# Patient Record
Sex: Male | Born: 1980 | Race: White | Hispanic: No | Marital: Single | Smoking: Current every day smoker
Health system: Southern US, Community
[De-identification: ages and names within clinical notes are randomized; demographics above are authoritative.]

## PROBLEM LIST (undated history)

## (undated) ENCOUNTER — Emergency Department (HOSPITAL_COMMUNITY): Payer: BLUE CROSS/BLUE SHIELD | Source: Home / Self Care

---
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
## 2001-10-08 ENCOUNTER — Emergency Department (HOSPITAL_COMMUNITY): Admission: EM | Admit: 2001-10-08 | Discharge: 2001-10-08 | Payer: Self-pay | Admitting: Emergency Medicine

## 2003-11-13 HISTORY — PX: ORIF FEMUR FRACTURE: SHX2119

## 2004-08-10 ENCOUNTER — Ambulatory Visit: Payer: Self-pay | Admitting: Physical Medicine & Rehabilitation

## 2004-08-10 ENCOUNTER — Inpatient Hospital Stay (HOSPITAL_COMMUNITY): Admission: AC | Admit: 2004-08-10 | Discharge: 2004-08-17 | Payer: Self-pay

## 2005-09-10 ENCOUNTER — Emergency Department (HOSPITAL_COMMUNITY): Admission: EM | Admit: 2005-09-10 | Discharge: 2005-09-11 | Payer: Self-pay | Admitting: Emergency Medicine

## 2005-09-27 ENCOUNTER — Encounter (INDEPENDENT_AMBULATORY_CARE_PROVIDER_SITE_OTHER): Payer: Self-pay | Admitting: *Deleted

## 2005-09-27 ENCOUNTER — Inpatient Hospital Stay (HOSPITAL_COMMUNITY): Admission: AD | Admit: 2005-09-27 | Discharge: 2005-09-29 | Payer: Self-pay | Admitting: Orthopedic Surgery

## 2005-10-04 ENCOUNTER — Emergency Department (HOSPITAL_COMMUNITY): Admission: EM | Admit: 2005-10-04 | Discharge: 2005-10-04 | Payer: Self-pay | Admitting: Emergency Medicine

## 2006-03-10 ENCOUNTER — Emergency Department (HOSPITAL_COMMUNITY): Admission: EM | Admit: 2006-03-10 | Discharge: 2006-03-10 | Payer: Self-pay | Admitting: Emergency Medicine

## 2006-03-12 ENCOUNTER — Emergency Department (HOSPITAL_COMMUNITY): Admission: EM | Admit: 2006-03-12 | Discharge: 2006-03-12 | Payer: Self-pay | Admitting: Family Medicine

## 2008-07-12 ENCOUNTER — Emergency Department (HOSPITAL_COMMUNITY): Admission: EM | Admit: 2008-07-12 | Discharge: 2008-07-12 | Payer: Self-pay | Admitting: Family Medicine

## 2009-06-20 ENCOUNTER — Emergency Department (HOSPITAL_COMMUNITY): Admission: EM | Admit: 2009-06-20 | Discharge: 2009-06-20 | Payer: Self-pay | Admitting: Emergency Medicine
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
## 2011-03-30 NOTE — Discharge Summary (Signed)
Blake [name], Blake [name]            ACCOUNT NO.:  [id number]   MEDICAL RECORD NO.:  [id number]          PATIENT TYPE:  INP   LOCATION:  5007                         FACILITY:  [facility]   PHYSICIAN:  Doralee Albino. Carola Frost, M.D. DATE OF BIRTH:  23-May-1981   DATE OF ADMISSION:  09/27/2005  DATE OF DISCHARGE:  09/29/2005                                 DISCHARGE SUMMARY   DISCHARGE DIAGNOSIS:  Left femoral nonunion.   PROCEDURES PERFORMED:  1.  A left femur removal of broken deep implant.  2.  Open treatment of femoral nonunion with compression and OP1      allografting.  3.  Intramedullary nailing using a Howmedica T2, 12 x 340 mm nail.   BRIEF SUMMARY OF HOSPITAL COURSE:  Blake [name] was taken to the operating room  on November 16 after a full discussion of the risk and benefits of surgery  for removal of his broken femoral nail and intramedullary compression  nailing using the T2 device.  At that time, we also anticipated treatment of  his long bone nonunion with OP1 allograft.  The patient tolerated the  procedure quite well, postoperatively was on a PCA for pain control and was  unable to be transitioned to p.o. pain meds alone until postop day two.  At  that time, he was tolerating a diet, had resumed urinary and bowel function,  and was deemed stable for discharge.  He was able to participate with  physical therapy using weight bearing as tolerated on his left lower  extremity.  He was on Lovenox for DVT prophylaxis while in the hospital with  plans to transition to an aspirin daily at discharge.   DISCHARGE INSTRUCTIONS:  Blake [name] will be weight bearing as tolerated on  the left lower extremity.  He will take Percocet one to two every six hours  as needed for pain, Oxycodone 5 mg every three hours in between Percocet  doses as needed for breakthrough pain and he also can take Robaxin for  muscle spasms.  He may shower after 72 hours, keep the leg under  compression, and practice active  and passive range of motion of the knee and  hip.  He will follow up at the clinic in 10 days for further evaluation and  new x-rays.      Doralee Albino. Carola Frost, M.D.  Electronically Signed     MHH/MEDQ  D:  12/06/2005  T:  12/06/2005  Job:  [id number]
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
## 2011-03-30 NOTE — Op Note (Signed)
NAMETREVANTE, Blake [name]            ACCOUNT NO.:  [id number]   MEDICAL RECORD NO.:  [id number]          PATIENT TYPE:  INP   LOCATION:  5031                         FACILITY:  [facility]   PHYSICIAN:  Jene Every, M.D.    DATE OF BIRTH:  10-25-1981   DATE OF PROCEDURE:  08/13/2004  DATE OF DISCHARGE:                                 OPERATIVE REPORT   PREOPERATIVE DIAGNOSIS:  Status post IM rod and I&D of open femur fracture.   POSTOPERATIVE DIAGNOSIS:  Status post intramedullary rod and I&D of open  femur fracture.   PROCEDURE:  Repeat I&D, debridement of left thigh wound.   SURGEON:  Jene Every, M.D.   ASSISTANT:  None.   ANESTHESIA:  General.   INDICATIONS FOR PROCEDURE:  This is a 30 year old status post IM rodding for  an open femur fracture.  He had a small puncture wound on the lateral aspect  of the thigh.  We did an I&D initially and performed a retrograde rodding.  Repeat I&D was indicated for a second look repeat I&D, to diminish the  chance of infection. Discussed with him the risks and benefits of this,  again, mainly to reduce the chance of infection.   DESCRIPTION OF PROCEDURE:  Placed in the supine position. After administered  of adequate general anesthesia, 1 g Kefzol, the left thigh region was block  draped, prepped and draped in the usual sterile fashion.  Removed the  previous stapled incisions and the Hemovac. This was done prior to the  prepping and draping.  I opened the wound and opened the loosely closed deep  facial layer.  Palpated the fracture. Delivered 6 L of saline through pulse  lavage and evacuation to the soft tissues, deep to the bone.  Palpated  around the fracture site and it was palpated as reduced and anatomic. There  was no devitalized tissue bleeding noted nor significant swelling of the  compartment.  There was obvious compromise of the vastus lateralis and the  fascia from the initial injury.   After the pulse lavage, we used  antibiotic irrigation. The wound was clean,  no evidence of infection, no retrained debris.  I loosely closed the fascia  with #1 Vicryl in a figure-of-eight suture, subcutaneous tissue was  reapproximated with 2-0 Vicryl simple sutures, skin was reapproximated with  staples.  Wound was dressed sterilely. I examined the knee again, there was  no instability of the knee noted.  The knee incisions looked excellent.  I  redressed those.  He had good pulses.   After the procedure, we awoke him without difficulty and transported him to  the recovery room in satisfactory condition. The patient tolerated the  procedure well, there were no complications. Blood loss minimal.       JB/MEDQ  D:  08/13/2004  T:  08/13/2004  Job:  [id number]

## 2011-03-30 NOTE — Op Note (Signed)
[place]  Patient:    Blake [name], Blake [name]                    MRN: [id number] Proc. Date: 03/21/00 Adm. Date:  [id number] Disc. Date: [id number] Attending:  Melody Comas.                           Operative Report  PREOPERATIVE DIAGNOSIS:  Probable epidermal inclusion cyst of the right earlobe.  POSTOPERATIVE DIAGNOSIS:  Probable epidermal inclusion cyst of the right earlobe.  OPERATION:  Excisional biopsy of dermal inclusion cyst of right earlobe.  SURGEON:  Janet Berlin. Dan Humphreys, M.D.  ANESTHESIA:  Local.  INDICATION:  Blake [name] is a 30 year old young man who has experienced cyclic swelling and pain in his right earlobe responsive to antibiotics.  It was felt to represent an epidermal inclusion cyst.  In order to prevent further development of this problem in the future, he was taken to the operating room for removal of the presumed epidermal inclusion cyst.  DESCRIPTION OF PROCEDURE:  The patient was brought back to the minor surgery operating room in the [facility].  He was placed on the table in the supine position with the head turned to the left.  The right ear and surrounding tissue were sterilely prepped and draped.  I infiltrated the earlobe with 1% Xylocaine containing epinephrine.  Through an elliptical excision, I dissected the soft tissue mass free from the surrounding parenchyma of the earlobe.  This lesion had connection both to the medial and lateral surfaces of the earlobe, visible as a reddened punctae.  Accordingly, I found it necessary to excise a small ellipse of skin on the lateral surface of the earlobe (external surface).  The resulting defect was closed with a running 6-0 monofilament nylon suture on the medial or internal surface and interrupted 6-0 monofilament nylon sutures on the external surface.  The removed lesion was found to be measure approximately 1 cm in maximal diameter.  The  patient and his father were given wound care instructions.  I will see him back in the office early next week for wound check, probable suture removal, and review of his final pathology report. DD:  03/21/00 TD:  03/23/00 Job: [id number]

## 2014-10-28 ENCOUNTER — Emergency Department (INDEPENDENT_AMBULATORY_CARE_PROVIDER_SITE_OTHER)
Admission: EM | Admit: 2014-10-28 | Discharge: 2014-10-28 | Disposition: A | Discharge status: Home / Self Care | Payer: Self-pay | Source: Home / Self Care | Attending: Emergency Medicine | Admitting: Emergency Medicine

## 2014-10-28 ENCOUNTER — Encounter (HOSPITAL_COMMUNITY): Payer: Self-pay | Admitting: *Deleted

## 2014-10-28 DIAGNOSIS — H6092 Unspecified otitis externa, left ear: Secondary | ICD-10-CM

## 2014-10-28 MED ORDER — CIPROFLOXACIN-DEXAMETHASONE 0.3-0.1 % OT SUSP: 4.0000 [drp] | Freq: Two times a day (BID) | OTIC | Status: DC

## 2014-10-28 NOTE — Discharge Instructions (Signed)
You have and infection of your ear canal. Use the Ciprodex twice a day for 7 days. Follow up as needed.  Otitis Externa Otitis externa is a germ infection in the outer ear. The outer ear is the area from the eardrum to the outside of the ear. Otitis externa is sometimes called "swimmer's ear." HOME CARE  Put drops in the ear as told by your doctor.  Only take medicine as told by your doctor.  If you have diabetes, your doctor may give you more directions. Follow your doctor's directions.  Keep all doctor visits as told. To avoid another infection:  Keep your ear dry. Use the corner of a towel to dry your ear after swimming or bathing.  Avoid scratching or putting things inside your ear.  Avoid swimming in lakes, dirty water, or pools that use a chemical called chlorine poorly.  You may use ear drops after swimming. Combine equal amounts of white vinegar and alcohol in a bottle. Put 3 or 4 drops in each ear. GET HELP IF:   You have a fever.  Your ear is still red, puffy (swollen), or painful after 3 days.  You still have yellowish-white fluid (pus) coming from the ear after 3 days.  Your redness, puffiness, or pain gets worse.  You have a really bad headache.  You have redness, puffiness, pain, or tenderness behind your ear. MAKE SURE YOU:   Understand these instructions.  Will watch your condition.  Will get help right away if you are not doing well or get worse. Document Released: 04/16/2008 Document Revised: 03/15/2014 Document Reviewed: 11/15/2011 [facility] Patient Information 2015 [place]Care, [place]. This information is not intended to replace advice given to you by your health care provider. Make sure you discuss any questions you have with your health care provider.

## 2014-10-28 NOTE — ED Provider Notes (Signed)
CSN: [id number]     Arrival date & time 10/28/14  1741 History   First MD Initiated Contact with Patient 10/28/14 1745     Chief Complaint  Patient presents with  . Otalgia   (Consider location/radiation/quality/duration/timing/severity/associated sxs/prior Treatment) HPI  He is a 33 year old man here for left ear pain. This started on December 7. He had amoxicillin left over from a dentist and took 7 days worth of amoxicillin. He states it improves the pain for a while, but it recurred today. He also feels like his hearing is decreased on that side. Denies any fevers, nasal congestion, rhinorrhea, sore throat, cough. He has had a prior ear infection when he was 16.  History reviewed. No pertinent past medical history. Past Surgical History  Procedure Laterality Date  . Orif femur fracture Left 2005    titanium rod   Family History  Problem Relation Age of Onset  . Heart failure Father    History  Substance Use Topics  . Smoking status: Current Every Day Smoker -- 1.00 packs/day    Types: Cigarettes  . Smokeless tobacco: Not on file  . Alcohol Use: 7.2 oz/week    6 Cans of beer, 6 Shots of liquor per week    Review of Systems  HENT: Positive for ear pain.   All other systems reviewed and are negative.   Allergies  Review of patient's allergies indicates not on file.  Home Medications   Prior to Admission medications   Medication Sig Start Date End Date Taking? Authorizing Provider  ciprofloxacin-dexamethasone (CIPRODEX) otic suspension Place 4 drops into the left ear 2 (two) times daily. For 1 week 10/28/14   Charm RingsErin J Demitria Hay, MD   There were no vitals taken for this visit. Physical Exam  Constitutional: He appears well-developed and well-nourished. No distress.  HENT:  Head: Normocephalic and atraumatic.  Right Ear: Tympanic membrane, external ear and ear canal normal.  Left Ear: Tympanic membrane normal.  Left ear canal is erythematous and swollen.  Pain with  traction of the tragus.    ED Course  Procedures (including critical care time) Labs Review Labs Reviewed - No data to display  Imaging Review No results found.   MDM   1. Left otitis externa    Treat with Ciprodex for 7 days. Follow-up as needed.    Charm RingsErin J Hamzeh Tall, MD 10/28/14 [phone]

## 2014-10-28 NOTE — ED Notes (Addendum)
C/o L earache onset last Monday 12/7.  No sore throat, runny nose or cough.  No fever. Took Amoxicillin QID for 7 days (left over from the dentist).  It helped but when he stopped the pain came back this AM.

## 2014-11-01 ENCOUNTER — Emergency Department (HOSPITAL_COMMUNITY): Payer: Self-pay

## 2014-11-01 ENCOUNTER — Emergency Department (HOSPITAL_COMMUNITY)
Admission: EM | Admit: 2014-11-01 | Discharge: 2014-11-01 | Disposition: A | Discharge status: Home / Self Care | Payer: Self-pay | Attending: Emergency Medicine | Admitting: Emergency Medicine

## 2014-11-01 ENCOUNTER — Encounter (HOSPITAL_COMMUNITY): Payer: Self-pay

## 2014-11-01 DIAGNOSIS — S91312A Laceration without foreign body, left foot, initial encounter: Secondary | ICD-10-CM | POA: Insufficient documentation

## 2014-11-01 DIAGNOSIS — IMO0002 Reserved for concepts with insufficient information to code with codable children: Secondary | ICD-10-CM

## 2014-11-01 DIAGNOSIS — Z720 Tobacco use: Secondary | ICD-10-CM | POA: Insufficient documentation

## 2014-11-01 DIAGNOSIS — Y9289 Other specified places as the place of occurrence of the external cause: Secondary | ICD-10-CM | POA: Insufficient documentation

## 2014-11-01 DIAGNOSIS — Z792 Long term (current) use of antibiotics: Secondary | ICD-10-CM | POA: Insufficient documentation

## 2014-11-01 DIAGNOSIS — Y9389 Activity, other specified: Secondary | ICD-10-CM | POA: Insufficient documentation

## 2014-11-01 DIAGNOSIS — Y998 Other external cause status: Secondary | ICD-10-CM | POA: Insufficient documentation

## 2014-11-01 DIAGNOSIS — W293XXA Contact with powered garden and outdoor hand tools and machinery, initial encounter: Secondary | ICD-10-CM | POA: Insufficient documentation

## 2014-11-01 MED ORDER — SODIUM CHLORIDE 0.9 % IV BOLUS (SEPSIS)
1000.0000 mL | Freq: Once | INTRAVENOUS | Status: AC
  Administered 2014-11-01: 1000 mL via INTRAVENOUS

## 2014-11-01 MED ORDER — LIDOCAINE HCL 2 % IJ SOLN
10.0000 mL | Freq: Once | INTRAMUSCULAR | Status: AC
  Administered 2014-11-01: 200 mg via INTRADERMAL
  Filled 2014-11-01: qty 20

## 2014-11-01 MED ORDER — NAPROXEN 500 MG PO TABS: 500.0000 mg | ORAL_TABLET | Freq: Two times a day (BID) | ORAL | Status: DC

## 2014-11-01 MED ORDER — ONDANSETRON HCL 4 MG/2ML IJ SOLN
4.0000 mg | Freq: Once | INTRAMUSCULAR | Status: AC
  Administered 2014-11-01: 4 mg via INTRAVENOUS
  Filled 2014-11-01: qty 2

## 2014-11-01 MED ORDER — HYDROMORPHONE HCL 1 MG/ML IJ SOLN
0.5000 mg | Freq: Once | INTRAMUSCULAR | Status: AC
  Administered 2014-11-01: 0.5 mg via INTRAVENOUS
  Filled 2014-11-01: qty 1

## 2014-11-01 MED ORDER — HYDROCODONE-ACETAMINOPHEN 5-325 MG PO TABS: 1.0000 | ORAL_TABLET | Freq: Four times a day (QID) | ORAL | Status: DC | PRN

## 2014-11-01 MED ORDER — CIPROFLOXACIN HCL 500 MG PO TABS: 500.0000 mg | ORAL_TABLET | Freq: Two times a day (BID) | ORAL | Status: DC

## 2014-11-01 NOTE — Discharge Instructions (Signed)
Keep the cut clean and dry. Wash with soap and water. Topical bacitracin twice a day. If any issues please follow up with Dr. Eulah PontMurphy as referred. Take cipro to prevent infection as prescribe. Return if any issues. Suture removal in 10 days.   Laceration Care, Adult A laceration is a cut or lesion that goes through all layers of the skin and into the tissue just beneath the skin. TREATMENT  Some lacerations may not require closure. Some lacerations may not be able to be closed due to an increased risk of infection. It is important to see your caregiver as soon as possible after an injury to minimize the risk of infection and maximize the opportunity for successful closure. If closure is appropriate, pain medicines may be given, if needed. The wound will be cleaned to help prevent infection. Your caregiver will use stitches (sutures), staples, wound glue (adhesive), or skin adhesive strips to repair the laceration. These tools bring the skin edges together to allow for faster healing and a better cosmetic outcome. However, all wounds will heal with a scar. Once the wound has healed, scarring can be minimized by covering the wound with sunscreen during the day for 1 full year. HOME CARE INSTRUCTIONS  For sutures or staples:  Keep the wound clean and dry.  If you were given a bandage (dressing), you should change it at least once a day. Also, change the dressing if it becomes wet or dirty, or as directed by your caregiver.  Wash the wound with soap and water 2 times a day. Rinse the wound off with water to remove all soap. Pat the wound dry with a clean towel.  After cleaning, apply a thin layer of the antibiotic ointment as recommended by your caregiver. This will help prevent infection and keep the dressing from sticking.  You may shower as usual after the first 24 hours. Do not soak the wound in water until the sutures are removed.  Only take over-the-counter or prescription medicines for pain,  discomfort, or fever as directed by your caregiver.  Get your sutures or staples removed as directed by your caregiver. For skin adhesive strips:  Keep the wound clean and dry.  Do not get the skin adhesive strips wet. You may bathe carefully, using caution to keep the wound dry.  If the wound gets wet, pat it dry with a clean towel.  Skin adhesive strips will fall off on their own. You may trim the strips as the wound heals. Do not remove skin adhesive strips that are still stuck to the wound. They will fall off in time. For wound adhesive:  You may briefly wet your wound in the shower or bath. Do not soak or scrub the wound. Do not swim. Avoid periods of heavy perspiration until the skin adhesive has fallen off on its own. After showering or bathing, gently pat the wound dry with a clean towel.  Do not apply liquid medicine, cream medicine, or ointment medicine to your wound while the skin adhesive is in place. This may loosen the film before your wound is healed.  If a dressing is placed over the wound, be careful not to apply tape directly over the skin adhesive. This may cause the adhesive to be pulled off before the wound is healed.  Avoid prolonged exposure to sunlight or tanning lamps while the skin adhesive is in place. Exposure to ultraviolet light in the first year will darken the scar.  The skin adhesive will usually remain  in place for 5 to 10 days, then naturally fall off the skin. Do not pick at the adhesive film. You may need a tetanus shot if:  You cannot remember when you had your last tetanus shot.  You have never had a tetanus shot. If you get a tetanus shot, your arm may swell, get red, and feel warm to the touch. This is common and not a problem. If you need a tetanus shot and you choose not to have one, there is a rare chance of getting tetanus. Sickness from tetanus can be serious. SEEK MEDICAL CARE IF:   You have redness, swelling, or increasing pain in the  wound.  You see a red line that goes away from the wound.  You have yellowish-white fluid (pus) coming from the wound.  You have a fever.  You notice a bad smell coming from the wound or dressing.  Your wound breaks open before or after sutures have been removed.  You notice something coming out of the wound such as wood or glass.  Your wound is on your hand or foot and you cannot move a finger or toe. SEEK IMMEDIATE MEDICAL CARE IF:   Your pain is not controlled with prescribed medicine.  You have severe swelling around the wound causing pain and numbness or a change in color in your arm, hand, leg, or foot.  Your wound splits open and starts bleeding.  You have worsening numbness, weakness, or loss of function of any joint around or beyond the wound.  You develop painful lumps near the wound or on the skin anywhere on your body. MAKE SURE YOU:   Understand these instructions.  Will watch your condition.  Will get help right away if you are not doing well or get worse. Document Released: 10/29/2005 Document Revised: 01/21/2012 Document Reviewed: 04/24/2011 [facility]Exit[facility] Patient Information 2015 [place]ExitCare, [place]LL[place]. This information is not intended to replace advice given to you by your health care provider. Make sure you discuss any questions you have with your health care provider.

## 2014-11-01 NOTE — ED Notes (Signed)
Pt. Reports approx 15  Min PTA he was cutting trees with a chainsaw, slipped, and cut foot with chainsaw. Pale and diaphoretic on arrival

## 2014-11-01 NOTE — ED Provider Notes (Signed)
CSN: [id number]     Arrival date & time 11/01/14  1105 History   First MD Initiated Contact with Patient 11/01/14 1113     Chief Complaint  Patient presents with  . Extremity Laceration     (Consider location/radiation/quality/duration/timing/severity/associated sxs/prior Treatment) HPI Blake Li is a 33 y.o. male with no medical problems, presents to ED after cutting his left foot on a chain saw. States he was cutting down tree, states he slipped, came to the ground and chainsaw slipped and cut his left foot. States it started bleeding heavily, states he wrapped it with cloth tightly. States tetanus up to date. Denies numbness or weakness in the foot. No other injuries. Did not fall, did not hit head, no LOC.   No past medical history on file. Past Surgical History  Procedure Laterality Date  . Orif femur fracture Left 2005    titanium rod   Family History  Problem Relation Age of Onset  . Heart failure Father    History  Substance Use Topics  . Smoking status: Current Every Day Smoker -- 1.00 packs/day    Types: Cigarettes  . Smokeless tobacco: Not on file  . Alcohol Use: 7.2 oz/week    6 Cans of beer, 6 Shots of liquor per week    Review of Systems  Constitutional: Negative for fever and chills.  Respiratory: Negative for cough, chest tightness and shortness of breath.   Cardiovascular: Negative for chest pain, palpitations and leg swelling.  Gastrointestinal: Negative for nausea, vomiting, abdominal pain, diarrhea and abdominal distention.  Genitourinary: Negative for dysuria, urgency, frequency and hematuria.  Musculoskeletal: Positive for arthralgias. Negative for neck pain and neck stiffness.  Skin: Positive for wound. Negative for rash.  Allergic/Immunologic: Negative for immunocompromised state.  Neurological: Negative for dizziness, weakness, light-headedness, numbness and headaches.      Allergies  Review of patient's allergies indicates no known  allergies.  Home Medications   Prior to Admission medications   Medication Sig Start Date End Date Taking? Authorizing Provider  ciprofloxacin-dexamethasone (CIPRODEX) otic suspension Place 4 drops into the left ear 2 (two) times daily. For 1 week 10/28/14   Charm RingsErin J Honig, MD   BP 124/94 mmHg  Pulse 78  Temp(Src) 97.3 F (36.3 C) (Oral)  Resp 22  SpO2 100% Physical Exam  Constitutional: He appears well-developed and well-nourished. No distress.  HENT:  Head: Normocephalic.  Neck: Normal range of motion. Neck supple.  Cardiovascular: Normal rate, regular rhythm and normal heart sounds.   Pulmonary/Chest: Effort normal and breath sounds normal. No respiratory distress. He has no wheezes. He has no rales.  Abdominal: Soft. There is no tenderness.  Musculoskeletal:  5 cm laceration to the dorsal left foot, laceration extends across the foot over 1st, 2nd distal metatarsals. Full rom of all toes. 5/5 and equal strength against resistance with toes 1-3 flexion and extension. sensation intact distally. Cap refill <2 sec  Skin: Skin is warm and dry.  Nursing note and vitals reviewed.   ED Course  Procedures (including critical care time) Labs Review Labs Reviewed - No data to display  Imaging Review No results found.  LACERATION REPAIR Performed by: Lottie MusselKIRICHENKO, Latalia Etzler A Authorized by: Jaynie CrumbleKIRICHENKO, Estellar Cadena A Consent: Verbal consent obtained. Risks and benefits: risks, benefits and alternatives were discussed Consent given by: patient Patient identity confirmed: provided demographic data Prepped and Draped in normal sterile fashion Wound explored  Laceration Location: left foot  Laceration Length: 5cm  Multiple small foreign bodies removed  Anesthesia:  local infiltration  Local anesthetic: lidocaine 2% wo epinephrine  Anesthetic total: 6 ml  Irrigation method: syringe Amount of cleaning: extensive.  SafeClense used, followed by sponge debridement, and irrigation with  1L of NS.   Skin closure: subcutaneous vicril rapid 4.0, skin prolene 4.0  Number of sutures: 3 subcut, 7 skin  Technique: simple interrupted  Patient tolerance: Patient tolerated the procedure well with no immediate complications.   MDM   Final diagnoses:  Laceration  Foot laceration, left, initial encounter    Patient with a deep laceration to the left foot, from a chain saw. Laceration is irregular, hemostatic at this time. X-rays obtained showed foreign bodies, no bony involvement. I thoroughly irrigated the wound with saline and safe cleanse, scrubbed with a sponge, repaired with sutures. Will start on antibiotic, follow-up with orthopedics. Patient is neurovascularly intact, no evidence of nerve, vascular, or tendon injury. Tetanus up to date Home on cipro given laceration on foot through sock and shoe  Filed Vitals:   11/01/14 1114 11/01/14 1200 11/01/14 1418  BP: 124/94 137/69 119/75  Pulse: 78 78 81  Temp: 97.3 F (36.3 C)  98 F (36.7 C)  TempSrc: Oral  Oral  Resp: 22  22  SpO2: 100% 100% 99%     Lottie Musselatyana A Saleah Rishel, PA-C 11/01/14 1504  Suzi RootsKevin E Steinl, MD 11/01/14 [phone]

## 2014-11-01 NOTE — Progress Notes (Signed)
Orthopedic Tech Progress Note Patient Details:  Blake Li 1980/12/17 [id number]  Ortho Devices Type of Ortho Device: Postop shoe/boot Ortho Device/Splint Location: lle Ortho Device/Splint Interventions: Application   Nigil Braman 11/01/2014, 1:51 PM

## 2017-12-16 ENCOUNTER — Encounter (INDEPENDENT_AMBULATORY_CARE_PROVIDER_SITE_OTHER): Payer: Self-pay

## 2017-12-16 ENCOUNTER — Ambulatory Visit: Payer: BLUE CROSS/BLUE SHIELD | Admitting: Emergency Medicine

## 2017-12-16 ENCOUNTER — Other Ambulatory Visit: Payer: Self-pay

## 2017-12-16 ENCOUNTER — Encounter: Payer: Self-pay | Admitting: Emergency Medicine

## 2017-12-16 VITALS — BP 130/90 | HR 108 | Temp 98.3°F | Resp 16 | Ht 64.75 in | Wt 186.0 lb

## 2017-12-16 DIAGNOSIS — R112 Nausea with vomiting, unspecified: Secondary | ICD-10-CM | POA: Diagnosis not present

## 2017-12-16 DIAGNOSIS — R1010 Upper abdominal pain, unspecified: Secondary | ICD-10-CM | POA: Insufficient documentation

## 2017-12-16 LAB — COMPREHENSIVE METABOLIC PANEL
ALK PHOS: 82 IU/L (ref 39–117)
ALT: 52 IU/L — AB (ref 0–44)
AST: 28 IU/L (ref 0–40)
Albumin/Globulin Ratio: 2.1 (ref 1.2–2.2)
Albumin: 4.9 g/dL (ref 3.5–5.5)
BILIRUBIN TOTAL: 0.5 mg/dL (ref 0.0–1.2)
BUN/Creatinine Ratio: 14 (ref 9–20)
BUN: 12 mg/dL (ref 6–20)
CHLORIDE: 94 mmol/L — AB (ref 96–106)
CO2: 30 mmol/L — ABNORMAL HIGH (ref 20–29)
Calcium: 11.4 mg/dL — ABNORMAL HIGH (ref 8.7–10.2)
Creatinine, Ser: 0.87 mg/dL (ref 0.76–1.27)
GFR calc Af Amer: 129 mL/min/{1.73_m2} (ref >59)
GFR calc non Af Amer: 112 mL/min/{1.73_m2} (ref >59)
GLUCOSE: 128 mg/dL — AB (ref 65–99)
Globulin, Total: 2.3 g/dL (ref 1.5–4.5)
Potassium: 3.7 mmol/L (ref 3.5–5.2)
Sodium: 144 mmol/L (ref 134–144)
Total Protein: 7.2 g/dL (ref 6.0–8.5)

## 2017-12-16 LAB — POCT CBC
GRANULOCYTE PERCENT: 84.1 % — AB (ref 37–80)
HEMATOCRIT: 50.1 % (ref 43.5–53.7)
HEMOGLOBIN: 16.8 g/dL (ref 14.1–18.1)
Lymph, poc: 1.3 (ref 0.6–3.4)
MCH, POC: 30.7 pg (ref 27–31.2)
MCHC: 33.6 g/dL (ref 31.8–35.4)
MCV: 91.3 fL (ref 80–97)
MID (cbc): 0.4 (ref 0–0.9)
MPV: 7.1 fL (ref 0–99.8)
POC GRANULOCYTE: 8.9 — AB (ref 2–6.9)
POC LYMPH PERCENT: 11.9 %L (ref 10–50)
POC MID %: 4 %M (ref 0–12)
Platelet Count, POC: 223 10*3/uL (ref 142–424)
RBC: 5.49 M/uL (ref 4.69–6.13)
RDW, POC: 12.9 %
WBC: 10.6 10*3/uL — AB (ref 4.6–10.2)

## 2017-12-16 MED ORDER — ONDANSETRON 4 MG PO TBDP
4.0000 mg | ORAL_TABLET | Freq: Once | ORAL | Status: AC
  Administered 2017-12-16: 4 mg via ORAL

## 2017-12-16 MED ORDER — ONDANSETRON HCL 4 MG PO TABS: 4.0000 mg | ORAL_TABLET | Freq: Three times a day (TID) | ORAL | 0 refills | Status: DC | PRN

## 2017-12-16 NOTE — Progress Notes (Signed)
Blake Li 37 y.o.   Chief Complaint  Patient presents with  . Abdominal Pain    started 12/15/2017  . Emesis    HISTORY OF PRESENT ILLNESS: This is a 37 y.o. male complaining of nausea and vomiting since yesterday morning.  Also upper abdominal pain.  Denies fever or chills.  Denies rectal bleeding or melena.  Denies flulike symptoms.  On no medications.  Denies EtOH abuse.  HPI.   Prior to Admission medications   Medication Sig Start Date End Date Taking? Authorizing Provider  HYDROcodone-acetaminophen (NORCO) 5-325 MG per tablet Take 1 tablet by mouth every 6 (six) hours as needed for moderate pain. Patient not taking: Reported on 12/16/2017 11/01/14   Jaynie Crumble, PA-C  naproxen (NAPROSYN) 500 MG tablet Take 1 tablet (500 mg total) by mouth 2 (two) times daily. Patient not taking: Reported on 12/16/2017 11/01/14   Jaynie Crumble, PA-C  naproxen sodium (ANAPROX) 220 MG tablet Take 440 mg by mouth 2 (two) times daily as needed (for pain).    [provider]    No Known Allergies  There are no active problems to display for this patient.   No past medical history on file.  Past Surgical History:  Procedure Laterality Date  . FRACTURE SURGERY Left 2002  . ORIF FEMUR FRACTURE Left 2005   titanium rod    Social History   Socioeconomic History  . Marital status: Single    Spouse name: Not on file  . Number of children: Not on file  . Years of education: Not on file  . Highest education level: Not on file  Social Needs  . Financial resource strain: Not on file  . Food insecurity - worry: Not on file  . Food insecurity - inability: Not on file  . Transportation needs - medical: Not on file  . Transportation needs - non-medical: Not on file  Occupational History  . Not on file  Tobacco Use  . Smoking status: Current Every Day Smoker    Packs/day: 1.00    Types: Cigarettes  . Smokeless tobacco: Never Used  Substance and Sexual Activity  .  Alcohol use: Yes    Alcohol/week: 7.2 oz    Types: 6 Cans of beer, 6 Shots of liquor per week  . Drug use: Yes    Frequency: 7.0 times per week    Types: Marijuana  . Sexual activity: Not on file  Other Topics Concern  . Not on file  Social History Narrative  . Not on file    Family History  Problem Relation Age of Onset  . Heart failure Father   . Diabetes Father   . Heart disease Father   . Hyperlipidemia Father      Review of Systems  Constitutional: Positive for fever. Negative for chills.  HENT: Negative for nosebleeds and sore throat.   Eyes: Negative for blurred vision and double vision.  Respiratory: Negative.  Negative for cough and shortness of breath.   Cardiovascular: Negative.  Negative for chest pain and palpitations.  Gastrointestinal: Positive for abdominal pain, nausea and vomiting. Negative for blood in stool, diarrhea and melena.  Genitourinary: Negative for dysuria and hematuria.  Musculoskeletal: Negative for back pain, myalgias and neck pain.  Skin: Negative.  Negative for rash.  Neurological: Negative.  Negative for dizziness and headaches.  Endo/Heme/Allergies: Negative.   All other systems reviewed and are negative.   Vitals:   12/16/17 1128  BP: 130/90  Pulse: (!) 108  Resp: 16  Temp: 98.3 F (36.8 C)  SpO2: 100%    Physical Exam  Constitutional: He is oriented to person, place, and time. He appears well-developed and well-nourished.  HENT:  Head: Normocephalic and atraumatic.  Nose: Nose normal.  Mouth/Throat: Oropharynx is clear and moist. No oropharyngeal exudate.  Eyes: Conjunctivae and EOM are normal. Pupils are equal, round, and reactive to light.  Neck: Normal range of motion. Neck supple. No JVD present.  Cardiovascular: Normal rate, regular rhythm and normal heart sounds.  Pulmonary/Chest: Effort normal and breath sounds normal.  Abdominal: Soft. Bowel sounds are normal. There is tenderness (mild epigastric).    Musculoskeletal: Normal range of motion. He exhibits no edema.  Lymphadenopathy:    He has no cervical adenopathy.  Neurological: He is alert and oriented to person, place, and time. No sensory deficit. He exhibits normal muscle tone.  Skin: Skin is warm and dry. Capillary refill takes less than 2 seconds. No rash noted.  Vitals reviewed.   Results for orders placed or performed in visit on 12/16/17 (from the past 24 hour(s))  POCT CBC     Status: Abnormal   Collection Time: 12/16/17 12:17 PM  Result Value Ref Range   WBC 10.6 (A) 4.6 - 10.2 K/uL   Lymph, poc 1.3 0.6 - 3.4   POC LYMPH PERCENT 11.9 10 - 50 %L   MID (cbc) 0.4 0 - 0.9   POC MID % 4.0 0 - 12 %M   POC Granulocyte 8.9 (A) 2 - 6.9   Granulocyte percent 84.1 (A) 37 - 80 %G   RBC 5.49 4.69 - 6.13 M/uL   Hemoglobin 16.8 14.1 - 18.1 g/dL   HCT, POC 29.550.1 18.843.5 - 53.7 %   MCV 91.3 80 - 97 fL   MCH, POC 30.7 27 - 31.2 pg   MCHC 33.6 31.8 - 35.4 g/dL   RDW, POC 41.612.9 %   Platelet Count, POC 223 142 - 424 K/uL   MPV 7.1 0 - 99.8 fL    ASSESSMENT & PLAN: Ethelene Brownsnthony was seen today for abdominal pain and emesis.  Diagnoses and all orders for this visit:  Intractable vomiting with nausea, unspecified vomiting type -     POCT CBC -     Comprehensive metabolic panel -     ondansetron (ZOFRAN-ODT) disintegrating tablet 4 mg  Upper abdominal pain  Other orders -     ondansetron (ZOFRAN) 4 MG tablet; Take 1 tablet (4 mg total) by mouth every 8 (eight) hours as needed for nausea or vomiting.    Patient Instructions       IF you received an x-ray today, you will receive an invoice from [facility]. Please contact [facility] at [phone] with questions or concerns regarding your invoice.   IF you received labwork today, you will receive an invoice from [facility]. Please contact [facility] at [phone] with questions or concerns regarding your invoice.   Our billing staff will not be able to assist you  with questions regarding bills from these companies.  You will be contacted with the lab results as soon as they are available. The fastest way to get your results is to activate your My Chart account. Instructions are located on the last page of this paperwork. If you have not heard from us regarding the results in 2 weeks, please contact this office.     Vomiting, Adult Vomiting occurs when stomach contents are thrown up and out of the mouth. Many people notice nausea before vomiting.  Vomiting can make you feel weak and dehydrated. Dehydration can make you tired and thirsty, cause you to have a dry mouth, and decrease how often you urinate. Older adults and people who have other diseases or a weak immune system are at higher risk for dehydration.It is important to treat vomiting as told by your health care provider. Follow these instructions at home: Follow your health care provider's instructions about how to care for yourself at home. Eating and drinking Follow these recommendations as told by your health care provider:  Take an oral rehydration solution (ORS). This is a drink that is sold at pharmacies and retail stores.  Eat bland, easy-to-digest foods in small amounts as you are able. These foods include bananas, applesauce, rice, lean meats, toast, and crackers.  Drink clear fluids in small amounts as you are able. Clear fluids include water, ice chips, low-calorie sports drinks, and fruit juice that has water added (diluted fruit juice).  Avoid fluids that contain a lot of sugar or caffeine.  Avoid alcohol and foods that are spicy or fatty.  General instructions   Wash your hands frequently with soap and water. If soap and water are not available, use hand sanitizer. Make sure that everyone in your household washes their hands frequently.  Take over-the-counter and prescription medicines only as told by your health care provider.  Watch your condition for any changes.  Keep  all follow-up visits as told by your health care provider. This is important. Contact a health care provider if:  You have a fever.  You are not able to keep fluids down.  Your vomiting gets worse.  You have new symptoms.  You feel light-headed or dizzy.  You have a headache.  You have muscle cramps. Get help right away if:  You have pain in your chest, neck, arm, or jaw.  You feel extremely weak or you faint.  You have persistent vomiting.  You have vomit that is bright red or looks like black coffee grounds.  You have stools that are bloody or black, or stools that look like tar.  You have severe pain, cramping, or bloating in your abdomen.  You have a severe headache, a stiff neck, or both.  You have a rash.  You have trouble breathing or you are breathing very quickly.  Your heart is beating very quickly.  Your skin feels cold and clammy.  You feel confused.  You have pain while urinating.  You have signs of dehydration, such as: ? Dark urine, or very little or no urine. ? Cracked lips. ? Dry mouth. ? Sunken eyes. ? Sleepiness. ? Weakness. These symptoms may represent a serious problem that is an emergency. Do not wait to see if the symptoms will go away. Get medical help right away. Call your local emergency services (911 in the [place] Do not drive yourself to the hospital. This information is not intended to replace advice given to you by your health care provider. Make sure you discuss any questions you have with your health care provider. Document Released: 11/25/2015 Document Revised: 04/05/2016 Document Reviewed: 07/05/2015 Elsevier Interactive Patient Education  2018 [employer]  Abdominal Pain, Adult Many things can cause belly (abdominal) pain. Most times, belly pain is not dangerous. Many cases of belly pain can be watched and treated at home. Sometimes belly pain is serious, though. Your doctor will try to find the cause of your belly  pain. Follow these instructions at home:  Take over-the-counter and prescription medicines  only as told by your doctor. Do not take medicines that help you poop (laxatives) unless told to by your doctor.  Drink enough fluid to keep your pee (urine) clear or pale yellow.  Watch your belly pain for any changes.  Keep all follow-up visits as told by your doctor. This is important. Contact a doctor if:  Your belly pain changes or gets worse.  You are not hungry, or you lose weight without trying.  You are having trouble pooping (constipated) or have watery poop (diarrhea) for more than 2-3 days.  You have pain when you pee or poop.  Your belly pain wakes you up at night.  Your pain gets worse with meals, after eating, or with certain foods.  You are throwing up and cannot keep anything down.  You have a fever. Get help right away if:  Your pain does not go away as soon as your doctor says it should.  You cannot stop throwing up.  Your pain is only in areas of your belly, such as the right side or the left lower part of the belly.  You have bloody or black poop, or poop that looks like tar.  You have very bad pain, cramping, or bloating in your belly.  You have signs of not having enough fluid or water in your body (dehydration), such as: ? Dark pee, very little pee, or no pee. ? Cracked lips. ? Dry mouth. ? Sunken eyes. ? Sleepiness. ? Weakness. This information is not intended to replace advice given to you by your health care provider. Make sure you discuss any questions you have with your health care provider. Document Released: 04/16/2008 Document Revised: 05/18/2016 Document Reviewed: 04/11/2016 Elsevier Interactive Patient Education  2018 Elsevier Inc.      Edwina Barth, MD [facility]

## 2017-12-16 NOTE — Patient Instructions (Addendum)
IF you received an x-ray today, you will receive an invoice from [facility]. Please contact [facility] at [phone] with questions or concerns regarding your invoice.   IF you received labwork today, you will receive an invoice from [facility]. Please contact [facility] at [phone] with questions or concerns regarding your invoice.   Our billing staff will not be able to assist you with questions regarding bills from these companies.  You will be contacted with the lab results as soon as they are available. The fastest way to get your results is to activate your My Chart account. Instructions are located on the last page of this paperwork. If you have not heard from us regarding the results in 2 weeks, please contact this office.     Vomiting, Adult Vomiting occurs when stomach contents are thrown up and out of the mouth. Many people notice nausea before vomiting. Vomiting can make you feel weak and dehydrated. Dehydration can make you tired and thirsty, cause you to have a dry mouth, and decrease how often you urinate. Older adults and people who have other diseases or a weak immune system are at higher risk for dehydration.It is important to treat vomiting as told by your health care provider. Follow these instructions at home: Follow your health care provider's instructions about how to care for yourself at home. Eating and drinking Follow these recommendations as told by your health care provider:  Take an oral rehydration solution (ORS). This is a drink that is sold at pharmacies and retail stores.  Eat bland, easy-to-digest foods in small amounts as you are able. These foods include bananas, applesauce, rice, lean meats, toast, and crackers.  Drink clear fluids in small amounts as you are able. Clear fluids include water, ice chips, low-calorie sports drinks, and fruit juice that has water added (diluted fruit juice).  Avoid fluids that contain a lot of  sugar or caffeine.  Avoid alcohol and foods that are spicy or fatty.  General instructions   Wash your hands frequently with soap and water. If soap and water are not available, use hand sanitizer. Make sure that everyone in your household washes their hands frequently.  Take over-the-counter and prescription medicines only as told by your health care provider.  Watch your condition for any changes.  Keep all follow-up visits as told by your health care provider. This is important. Contact a health care provider if:  You have a fever.  You are not able to keep fluids down.  Your vomiting gets worse.  You have new symptoms.  You feel light-headed or dizzy.  You have a headache.  You have muscle cramps. Get help right away if:  You have pain in your chest, neck, arm, or jaw.  You feel extremely weak or you faint.  You have persistent vomiting.  You have vomit that is bright red or looks like black coffee grounds.  You have stools that are bloody or black, or stools that look like tar.  You have severe pain, cramping, or bloating in your abdomen.  You have a severe headache, a stiff neck, or both.  You have a rash.  You have trouble breathing or you are breathing very quickly.  Your heart is beating very quickly.  Your skin feels cold and clammy.  You feel confused.  You have pain while urinating.  You have signs of dehydration, such as: ? Dark urine, or very little or no urine. ? Cracked lips. ? Dry mouth. ?  Sunken eyes. ? Sleepiness. ? Weakness. These symptoms may represent a serious problem that is an emergency. Do not wait to see if the symptoms will go away. Get medical help right away. Call your local emergency services (911 in the [place] Do not drive yourself to the hospital. This information is not intended to replace advice given to you by your health care provider. Make sure you discuss any questions you have with your health care  provider. Document Released: 11/25/2015 Document Revised: 04/05/2016 Document Reviewed: 07/05/2015 Elsevier Interactive Patient Education  2018 [employer] Inc[employer]  Abdominal Pain, Adult Many things can cause belly (abdominal) pain. Most times, belly pain is not dangerous. Many cases of belly pain can be watched and treated at home. Sometimes belly pain is serious, though. Your doctor will try to find the cause of your belly pain. Follow these instructions at home:  Take over-the-counter and prescription medicines only as told by your doctor. Do not take medicines that help you poop (laxatives) unless told to by your doctor.  Drink enough fluid to keep your pee (urine) clear or pale yellow.  Watch your belly pain for any changes.  Keep all follow-up visits as told by your doctor. This is important. Contact a doctor if:  Your belly pain changes or gets worse.  You are not hungry, or you lose weight without trying.  You are having trouble pooping (constipated) or have watery poop (diarrhea) for more than 2-3 days.  You have pain when you pee or poop.  Your belly pain wakes you up at night.  Your pain gets worse with meals, after eating, or with certain foods.  You are throwing up and cannot keep anything down.  You have a fever. Get help right away if:  Your pain does not go away as soon as your doctor says it should.  You cannot stop throwing up.  Your pain is only in areas of your belly, such as the right side or the left lower part of the belly.  You have bloody or black poop, or poop that looks like tar.  You have very bad pain, cramping, or bloating in your belly.  You have signs of not having enough fluid or water in your body (dehydration), such as: ? Dark pee, very little pee, or no pee. ? Cracked lips. ? Dry mouth. ? Sunken eyes. ? Sleepiness. ? Weakness. This information is not intended to replace advice given to you by your health care provider. Make sure you  discuss any questions you have with your health care provider. Document Released: 04/16/2008 Document Revised: 05/18/2016 Document Reviewed: 04/11/2016 Elsevier Interactive Patient Education  2018 [employer].

## 2017-12-17 ENCOUNTER — Emergency Department (HOSPITAL_COMMUNITY): Payer: BLUE CROSS/BLUE SHIELD

## 2017-12-17 ENCOUNTER — Emergency Department (HOSPITAL_COMMUNITY)
Admission: EM | Admit: 2017-12-17 | Discharge: 2017-12-18 | Disposition: A | Discharge status: Home / Self Care | Payer: BLUE CROSS/BLUE SHIELD | Attending: Emergency Medicine | Admitting: Emergency Medicine

## 2017-12-17 ENCOUNTER — Encounter (HOSPITAL_COMMUNITY): Payer: Self-pay | Admitting: Emergency Medicine

## 2017-12-17 ENCOUNTER — Ambulatory Visit: Payer: Self-pay

## 2017-12-17 DIAGNOSIS — R1010 Upper abdominal pain, unspecified: Secondary | ICD-10-CM | POA: Diagnosis present

## 2017-12-17 DIAGNOSIS — Z79899 Other long term (current) drug therapy: Secondary | ICD-10-CM | POA: Insufficient documentation

## 2017-12-17 DIAGNOSIS — F17210 Nicotine dependence, cigarettes, uncomplicated: Secondary | ICD-10-CM | POA: Diagnosis not present

## 2017-12-17 DIAGNOSIS — K2900 Acute gastritis without bleeding: Secondary | ICD-10-CM | POA: Insufficient documentation

## 2017-12-17 LAB — COMPREHENSIVE METABOLIC PANEL
ALBUMIN: 4.8 g/dL (ref 3.5–5.0)
ALK PHOS: 76 U/L (ref 38–126)
ALT: 48 U/L (ref 17–63)
AST: 32 U/L (ref 15–41)
Anion gap: 16 — ABNORMAL HIGH (ref 5–15)
BUN: 16 mg/dL (ref 6–20)
CALCIUM: 11.7 mg/dL — AB (ref 8.9–10.3)
CHLORIDE: 90 mmol/L — AB (ref 101–111)
CO2: 31 mmol/L (ref 22–32)
CREATININE: 1.2 mg/dL (ref 0.61–1.24)
GFR calc Af Amer: >60 mL/min (ref >60)
GFR calc non Af Amer: >60 mL/min (ref >60)
GLUCOSE: 122 mg/dL — AB (ref 65–99)
Potassium: 2.9 mmol/L — ABNORMAL LOW (ref 3.5–5.1)
Sodium: 137 mmol/L (ref 135–145)
Total Bilirubin: 0.9 mg/dL (ref 0.3–1.2)
Total Protein: 7.8 g/dL (ref 6.5–8.1)

## 2017-12-17 LAB — URINALYSIS, ROUTINE W REFLEX MICROSCOPIC
Bacteria, UA: NONE SEEN
Bilirubin Urine: NEGATIVE
Glucose, UA: NEGATIVE mg/dL
Ketones, ur: 5 mg/dL — AB
Leukocytes, UA: NEGATIVE
Nitrite: NEGATIVE
PROTEIN: NEGATIVE mg/dL
pH: 6 (ref 5.0–8.0)

## 2017-12-17 LAB — CBC
HCT: 51.4 % (ref 39.0–52.0)
HEMOGLOBIN: 18.8 g/dL — AB (ref 13.0–17.0)
MCH: 32.7 pg (ref 26.0–34.0)
MCHC: 36.6 g/dL — AB (ref 30.0–36.0)
MCV: 89.4 fL (ref 78.0–100.0)
PLATELETS: 218 10*3/uL (ref 150–400)
RBC: 5.75 MIL/uL (ref 4.22–5.81)
RDW: 12.2 % (ref 11.5–15.5)
WBC: 13.2 10*3/uL — ABNORMAL HIGH (ref 4.0–10.5)

## 2017-12-17 LAB — LIPASE, BLOOD: Lipase: 27 U/L (ref 11–51)

## 2017-12-17 MED ORDER — ONDANSETRON 4 MG PO TBDP
4.0000 mg | ORAL_TABLET | Freq: Once | ORAL | Status: AC | PRN
  Administered 2017-12-17: 4 mg via ORAL
  Filled 2017-12-17: qty 1

## 2017-12-17 MED ORDER — PROMETHAZINE HCL 25 MG/ML IJ SOLN
25.0000 mg | Freq: Once | INTRAMUSCULAR | Status: AC
  Administered 2017-12-17: 25 mg via INTRAVENOUS
  Filled 2017-12-17: qty 1

## 2017-12-17 MED ORDER — POTASSIUM CHLORIDE 10 MEQ/100ML IV SOLN
10.0000 meq | INTRAVENOUS | Status: AC
  Administered 2017-12-17: 10 meq via INTRAVENOUS
  Filled 2017-12-17: qty 100

## 2017-12-17 MED ORDER — HYDROMORPHONE HCL 1 MG/ML IJ SOLN
1.0000 mg | Freq: Once | INTRAMUSCULAR | Status: AC
  Administered 2017-12-17: 1 mg via INTRAVENOUS
  Filled 2017-12-17: qty 1

## 2017-12-17 MED ORDER — PANTOPRAZOLE SODIUM 40 MG IV SOLR
40.0000 mg | Freq: Once | INTRAVENOUS | Status: AC
  Administered 2017-12-17: 40 mg via INTRAVENOUS
  Filled 2017-12-17: qty 40

## 2017-12-17 MED ORDER — SODIUM CHLORIDE 0.9 % IV BOLUS (SEPSIS)
1000.0000 mL | Freq: Once | INTRAVENOUS | Status: AC
  Administered 2017-12-17: 1000 mL via INTRAVENOUS

## 2017-12-17 MED ORDER — SODIUM CHLORIDE 0.9 % IJ SOLN
INTRAMUSCULAR | Status: AC
  Filled 2017-12-17: qty 50

## 2017-12-17 MED ORDER — IOPAMIDOL (ISOVUE-300) INJECTION 61%
INTRAVENOUS | Status: AC
  Administered 2017-12-17: 100 mL via INTRAVENOUS
  Filled 2017-12-17: qty 100

## 2017-12-17 MED ORDER — GI COCKTAIL ~~LOC~~
30.0000 mL | Freq: Once | ORAL | Status: AC
  Administered 2017-12-17: 30 mL via ORAL
  Filled 2017-12-17: qty 30
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
## 2017-12-17 NOTE — ED Notes (Signed)
Patient aware that we need a urine specimen 

## 2017-12-17 NOTE — Telephone Encounter (Signed)
Recommend he goes to ER.

## 2017-12-17 NOTE — ED Triage Notes (Signed)
Patient reports he was seen at his PCP for LUQ abdominal pain and emesis onset of Saturday. Was diagnosed with food poisoning but symptoms are persistent and wanted to be reevaluated.

## 2017-12-17 NOTE — Telephone Encounter (Signed)
Pt called to report sudden onset of worsening bilateral upper abdominal pain that started 1000 this am . Pain does not shoot anywhere and onset was gradual. Pt states pain is an 8-9 out of 10 and describes it as "sharp" and "pressure". Pain is intermittent but pt states it does not go away for very long. Pt states nothing makes it better and eating causes pain and induces vomiting. Pt states he thinks it's related to "food poisoning." Care advice given with pt instructed to be NPO. Advised to go to nearest ED and have someone drive him. Called [facility] and [facility] Chales AbrahamsUpdated Shannon and ED disposition. Reason for Disposition . [1] SEVERE pain (e.g., excruciating) AND [2] present > 1 hour  Answer Assessment - Initial Assessment Questions 1. LOCATION: "Where does it hurt?"      Hurts underneath both sides of the rib cage 2. RADIATION: "Does the pain shoot anywhere else?" (e.g., chest, back)     No just contained to abdomen 3. ONSET: "When did the pain begin?" (Minutes, hours or days ago)      1000  4. SUDDEN: "Gradual or sudden onset?"     Gradual onset 5. PATTERN "Does the pain come and go, or is it constant?"    - If constant: "Is it getting better, staying the same, or worsening?"      (Note: Constant means the pain never goes away completely; most serious pain is constant and it progresses)     - If intermittent: "How long does it last?" "Do you have pain now?"     (Note: Intermittent means the pain goes away completely between bouts)     Comes and goes  6. SEVERITY: "How bad is the pain?"  (e.g., Scale 1-10; mild, moderate, or severe)    - MILD (1-3): doesn't interfere with normal activities, abdomen soft and not tender to touch     - MODERATE (4-7): interferes with normal activities or awakens from sleep, tender to touch     - SEVERE (8-10): excruciating pain, doubled over, unable to do any normal activities       8-9 7. RECURRENT SYMPTOM: "Have you ever had this type of  abdominal pain before?" If so, ask: "When was the last time?" and "What happened that time?"      no 8. CAUSE: "What do you think is causing the abdominal pain?"     Questioning food poisoning  9. RELIEVING/AGGRAVATING FACTORS: "What makes it better or worse?" (e.g., movement, antacids, bowel movement)     Nothing makes it better- eating makes it worse 10. OTHER SYMPTOMS: "Has there been any vomiting, diarrhea, constipation, or urine problems?"       Vomiting past 3 days and constipated past 3 days but has not eaten since Sunday  Protocols used: ABDOMINAL PAIN - MALE-A-AH

## 2017-12-18 ENCOUNTER — Encounter: Payer: Self-pay | Admitting: Radiology

## 2017-12-18 MED ORDER — SUCRALFATE 1 G PO TABS: 1.0000 g | ORAL_TABLET | Freq: Three times a day (TID) | ORAL | 0 refills | Status: DC

## 2017-12-18 MED ORDER — POTASSIUM CHLORIDE ER 10 MEQ PO TBCR: 10.0000 meq | EXTENDED_RELEASE_TABLET | Freq: Two times a day (BID) | ORAL | 0 refills | Status: DC

## 2017-12-18 MED ORDER — PROMETHAZINE HCL 25 MG PO TABS: 25.0000 mg | ORAL_TABLET | Freq: Four times a day (QID) | ORAL | 0 refills | Status: DC | PRN

## 2017-12-18 NOTE — Discharge Instructions (Addendum)
Take 1 tablet of Phenergan every 6 hours as needed for nausea or vomiting.  Take 1 tablet of Carafate by mouth with meals and at bedtime, up to 4 times per day.  Take 1 tablet of potassium chloride twice daily for the next 3 days.  You can also purchase pantoprazole, which is available over the counter to take daily for the next 2 weeks. Avoid alcohol until your symptoms improve.  Drink 6-8 glasses of 8 ounces of water or electrolyte drinks, such as Gatorade daily. I have attached information on bland diets that include foods that are easy to digest.  Tylenol and ibuprofen can be taken at home for pain.  If you develop new or worsening symptoms, including vomiting despite taking Phenergan, a high fever despite taking Tylenol, severe abdominal pain, or new concerning symptoms, he is return to the emergency department for re-evaluation.

## 2018-01-13 ENCOUNTER — Encounter (HOSPITAL_COMMUNITY): Payer: Self-pay | Admitting: Emergency Medicine

## 2018-01-13 ENCOUNTER — Ambulatory Visit (HOSPITAL_COMMUNITY)
Admission: EM | Admit: 2018-01-13 | Discharge: 2018-01-13 | Disposition: A | Discharge status: Home / Self Care | Payer: BLUE CROSS/BLUE SHIELD | Attending: Emergency Medicine | Admitting: Emergency Medicine

## 2018-01-13 DIAGNOSIS — W312XXA Contact with powered woodworking and forming machines, initial encounter: Secondary | ICD-10-CM

## 2018-01-13 DIAGNOSIS — S61211A Laceration without foreign body of left index finger without damage to nail, initial encounter: Secondary | ICD-10-CM | POA: Diagnosis not present

## 2018-01-13 DIAGNOSIS — Z23 Encounter for immunization: Secondary | ICD-10-CM

## 2018-01-13 MED ORDER — TETANUS-DIPHTH-ACELL PERTUSSIS 5-2.5-18.5 LF-MCG/0.5 IM SUSP
INTRAMUSCULAR | Status: AC
  Filled 2018-01-13: qty 0.5

## 2018-01-13 MED ORDER — TETANUS-DIPHTH-ACELL PERTUSSIS 5-2.5-18.5 LF-MCG/0.5 IM SUSP
0.5000 mL | Freq: Once | INTRAMUSCULAR | Status: AC
  Administered 2018-01-13: 0.5 mL via INTRAMUSCULAR

## 2018-01-13 MED ORDER — IBUPROFEN 600 MG PO TABS: 600.0000 mg | ORAL_TABLET | Freq: Four times a day (QID) | ORAL | 0 refills | Status: DC | PRN

## 2018-01-13 NOTE — ED Triage Notes (Signed)
Pt here for left index finger crush injury from saw; bleeding controlled

## 2018-01-13 NOTE — Discharge Instructions (Signed)
600 mg of ibuprofen with 1 g of Tylenol 3-4 times a day as needed for pain.  Keep this bandage on for the next 48 hours.  Return sooner for signs of infection

## 2018-01-13 NOTE — ED Provider Notes (Signed)
HPI  SUBJECTIVE:  Blake Li is a right-handed 37 y.o. male who presents with laceration to his left distal index finger sustained about 4 hours prior to evaluation.  States that he was using a sawz-all power tool and his finger got caught in between the blade and the guard.  He reports bleeding which he controlled with direct pressure.  He did not irrigate it.  He reports tingling.  No foreign body sensation, numbness, limitation of motion.  He reports throbbing, constant pain.  He tried a pressure dressing with hemostasis, no other alleviating factors.  He states his symptoms are worse when he uses his finger.  He is a smoker.  No history of diabetes, hypertension.  PMD: [place].  Unsure of when his last tetanus was.    History reviewed. No pertinent past medical history.  Past Surgical History:  Procedure Laterality Date  . FRACTURE SURGERY Left 2002  . ORIF FEMUR FRACTURE Left 2005   titanium rod    Family History  Problem Relation Age of Onset  . Heart failure Father   . Diabetes Father   . Heart disease Father   . Hyperlipidemia Father     Social History   Tobacco Use  . Smoking status: Current Every Day Smoker    Packs/day: 1.00    Types: Cigarettes  . Smokeless tobacco: Never Used  Substance Use Topics  . Alcohol use: Yes    Alcohol/week: 7.2 oz    Types: 6 Cans of beer, 6 Shots of liquor per week  . Drug use: Yes    Frequency: 7.0 times per week    Types: Marijuana    No current facility-administered medications for this encounter.   Current Outpatient Medications:  .  calcium carbonate (TUMS - DOSED IN MG ELEMENTAL CALCIUM) 500 MG chewable tablet, Chew 2 tablets by mouth daily as needed for indigestion or heartburn., Disp: , Rfl:  .  ibuprofen (ADVIL,MOTRIN) 600 MG tablet, Take 1 tablet (600 mg total) by mouth every 6 (six) hours as needed., Disp: 30 tablet, Rfl: 0 .  ondansetron (ZOFRAN) 4 MG tablet, Take 1 tablet (4 mg total) by mouth every 8 (eight)  hours as needed for nausea or vomiting., Disp: 20 tablet, Rfl: 0 .  potassium chloride (K-DUR) 10 MEQ tablet, Take 1 tablet (10 mEq total) by mouth 2 (two) times daily for 3 days., Disp: 6 tablet, Rfl: 0 .  promethazine (PHENERGAN) 25 MG tablet, Take 1 tablet (25 mg total) by mouth every 6 (six) hours as needed for nausea or vomiting., Disp: 30 tablet, Rfl: 0 .  sucralfate (CARAFATE) 1 g tablet, Take 1 tablet (1 g total) by mouth 4 (four) times daily -  with meals and at bedtime., Disp: 30 tablet, Rfl: 0  No Known Allergies   ROS  As noted in HPI.   Physical Exam  BP (!) 143/94 (BP Location: Left Arm)   Pulse 72   Temp 98.2 F (36.8 C) (Oral)   Resp 18   SpO2 100%   Constitutional: Well developed, well nourished, no acute distress Eyes:  EOMI, conjunctiva normal bilaterally HENT: Normocephalic, atraumatic,mucus membranes moist Respiratory: Normal inspiratory effort Cardiovascular: Normal rate GI: nondistended skin: No rash, skin intact Musculoskeletal: 2 cm laceration distal medial left index finger.  Two-point discrimination intact distally.  FDP/FDS intact.  No bony tenderness, crepitus.  See picture.     Neurologic: Alert & oriented x 3, no focal neuro deficits Psychiatric: Speech and behavior appropriate   ED Course  Medications  Tdap (BOOSTRIX) injection 0.5 mL (0.5 mLs Intramuscular Given 01/13/18 1240)    No orders of the defined types were placed in this encounter.   No results found for this or any previous visit (from the past 24 hour(s)). No results found.  ED Clinical Impression  Laceration of left index finger without foreign body without damage to nail, initial encounter   ED Assessment/Plan  Procedure note: Had patient scrub finger with soap and water, then soaked in chlorhexidine soap/tap water.  Performed a digital block with 1 cc 2% plain lidocaine with adequate anesthesia.  Then scrubbed with chlorhexidine soap and tap water.  Explored wound  with adequate hemostasis.  No foreign body, neurovascular involvement noted.  Placed  3 5-0 interrupted Prolene sutures..  Then placed bacitracin, pressure dressing.  Patient tolerated procedure well.  Updating tetanus.  Home with Tylenol, ibuprofen.  Do not think the patient needs prophylactic antibiotics.  Return here or follow-up with his primary care physician in 10 days for suture removal, sooner for any signs of infection.  Patient agrees with plan  Meds ordered this encounter  Medications  . Tdap (BOOSTRIX) injection 0.5 mL  . ibuprofen (ADVIL,MOTRIN) 600 MG tablet    Sig: Take 1 tablet (600 mg total) by mouth every 6 (six) hours as needed.    Dispense:  30 tablet    Refill:  0    *This clinic note was created using Scientist, clinical (histocompatibility and immunogenetics). Therefore, there may be occasional mistakes despite careful proofreading.   ?   Domenick Gong, MD 01/14/18 [phone]

## 2018-09-14 ENCOUNTER — Other Ambulatory Visit: Payer: Self-pay

## 2018-09-14 ENCOUNTER — Ambulatory Visit (HOSPITAL_COMMUNITY)
Admission: EM | Admit: 2018-09-14 | Discharge: 2018-09-14 | Disposition: A | Discharge status: Home / Self Care | Payer: BLUE CROSS/BLUE SHIELD | Attending: Emergency Medicine | Admitting: Emergency Medicine

## 2018-09-14 ENCOUNTER — Encounter (HOSPITAL_COMMUNITY): Payer: Self-pay

## 2018-09-14 DIAGNOSIS — R1032 Left lower quadrant pain: Secondary | ICD-10-CM

## 2018-09-14 DIAGNOSIS — M7918 Myalgia, other site: Secondary | ICD-10-CM

## 2018-09-14 MED ORDER — MELOXICAM 7.5 MG PO TABS: 7.5000 mg | ORAL_TABLET | Freq: Every day | ORAL | 0 refills | Status: AC

## 2018-09-14 NOTE — ED Provider Notes (Signed)
[facility]    CSN: [id number] Arrival date & time: 09/14/18  1001     History   Chief Complaint Chief Complaint  Patient presents with  . Abdominal Pain    HPI Blake Li is a 37 y.o. male.   Blake Li presents with complaints of LLQ abdominal pain which started two days ago after lifting his trailer off of the hitch. Pain has been ever since, has not improved. Has not worsened. Worse with bending over, movement of the torso, or straining to have BM or to urinate. No fevers. BM's have been  Normal, last today. No nausea or vomiting. No fevers. Denies any previous similar. Has been able to eat and drink. Denies scrotal or testicular pain, redness or swelling. Took aleve which did not help. Pain 8/10. Without contributing medical history.      ROS per HPI.      History reviewed. No pertinent past medical history.  Patient Active Problem List   Diagnosis Date Noted  . Intractable vomiting with nausea 12/16/2017  . Upper abdominal pain 12/16/2017    Past Surgical History:  Procedure Laterality Date  . FRACTURE SURGERY Left 2002  . ORIF FEMUR FRACTURE Left 2005   titanium rod       Home Medications    Prior to Admission medications   Medication Sig Start Date End Date Taking? Authorizing Provider  calcium carbonate (TUMS - DOSED IN MG ELEMENTAL CALCIUM) 500 MG chewable tablet Chew 2 tablets by mouth daily as needed for indigestion or heartburn.    [provider]  ibuprofen (ADVIL,MOTRIN) 600 MG tablet Take 1 tablet (600 mg total) by mouth every 6 (six) hours as needed. 01/13/18   Domenick Gong, MD  meloxicam (MOBIC) 7.5 MG tablet Take 1 tablet (7.5 mg total) by mouth daily. 09/14/18   Georgetta Haber, NP  potassium chloride (K-DUR) 10 MEQ tablet Take 1 tablet (10 mEq total) by mouth 2 (two) times daily for 3 days. 12/18/17 12/21/17  McDonald, Coral Else, PA-C    Family History Family History  Problem Relation Age of Onset  . Heart failure Father    . Diabetes Father   . Heart disease Father   . Hyperlipidemia Father     Social History Social History   Tobacco Use  . Smoking status: Current Every Day Smoker    Packs/day: 1.00    Types: Cigarettes  . Smokeless tobacco: Never Used  Substance Use Topics  . Alcohol use: Yes    Alcohol/week: 12.0 standard drinks    Types: 6 Cans of beer, 6 Shots of liquor per week  . Drug use: Yes    Frequency: 7.0 times per week    Types: Marijuana     Allergies   Patient has no known allergies.   Review of Systems Review of Systems   Physical Exam Triage Vital Signs ED Triage Vitals  Enc Vitals Group     BP 09/14/18 1013 (!) 145/95     Pulse Rate 09/14/18 1013 98     Resp 09/14/18 1013 18     Temp 09/14/18 1013 99.2 F (37.3 C)     Temp Source 09/14/18 1013 Oral     SpO2 09/14/18 1013 100 %     Weight 09/14/18 1014 195 lb (88.5 kg)     Height --      Head Circumference --      Peak Flow --      Pain Score 09/14/18 1014 8  Pain Loc --      Pain Edu? --      Excl. in GC? --    No data found.  Updated Vital Signs BP (!) 145/95 (BP Location: Left Arm)   Pulse 98   Temp 99.2 F (37.3 C) (Oral)   Resp 18   Wt 195 lb (88.5 kg)   SpO2 100%   BMI 32.70 kg/m    Physical Exam  Constitutional: He is oriented to person, place, and time. He appears well-developed and well-nourished.  Cardiovascular: Normal rate and regular rhythm.  Pulmonary/Chest: Effort normal and breath sounds normal.  Abdominal: Soft. Normal appearance and bowel sounds are normal. There is no hepatosplenomegaly, splenomegaly or hepatomegaly. There is tenderness in the left lower quadrant. There is no rigidity, no rebound, no guarding, no CVA tenderness, no tenderness at McBurney's point and negative Murphy's sign. No hernia.  No palpable hernia with bearing down; no visible bulge or discoloration; noted discomfort to LLQ with transition from sit to lay and lay to sit with engagement of abdominal  musculature   Neurological: He is alert and oriented to person, place, and time.  Skin: Skin is warm and dry.     UC Treatments / Results  Labs (all labs ordered are listed, but only abnormal results are displayed) Labs Reviewed - No data to display  EKG None  Radiology No results found.  Procedures Procedures (including critical care time)  Medications Ordered in UC Medications - No data to display  Initial Impression / Assessment and Plan / UC Course  I have reviewed the triage vital signs and the nursing notes.  Pertinent labs & imaging results that were available during my care of the patient were reviewed by me and considered in my medical decision making (see chart for details).     Non toxic, afebrile. No nausea, vomiting or diarrhea. Normal BM's. Muscular strain vs hernia discussed with patient. No palpable hernia today. Encouraged limited heavy lifting and straining of the trunk for the next few days to allow for healing, keep stools soft to prevent straining. To follow up with PCP and/or general surgery for persistent symptoms as may need further evaluation. No indications of incarcerated hernia at this time. Return precautions provided. Patient verbalized understanding and agreeable to plan.    Final Clinical Impressions(s) / UC Diagnoses   Final diagnoses:  Muscular abdominal pain in left lower quadrant     Discharge Instructions     Concern for possible hernia vs muscle strain.  Meloxicam daily to hep with pain, don't take additional aleve while taking and take with food.  Use of miralax as needed to keep bowel movements soft to prevent straining.  Limit heavy lifting or bending at least for the next 3 days or until improvement of pain.  May need further imaging to evaluate this, therefore I would recommend following up with your primary care provider or with general surgery if needed.  If pain becomes severe or increased, constant, develop fever, unable to  have Bm, nausea, vomiting or otherwise worsening please go to the Er.    ED Prescriptions    Medication Sig Dispense Auth. Provider   meloxicam (MOBIC) 7.5 MG tablet Take 1 tablet (7.5 mg total) by mouth daily. 20 tablet Georgetta Haber, NP     Controlled Substance Prescriptions [place] Controlled Substance Registry consulted? Not Applicable   Georgetta Haber, NP 09/14/18 1038

## 2018-09-14 NOTE — Discharge Instructions (Addendum)
Concern for possible hernia vs muscle strain.  Meloxicam daily to hep with pain, don't take additional aleve while taking and take with food.  Use of miralax as needed to keep bowel movements soft to prevent straining.  Limit heavy lifting or bending at least for the next 3 days or until improvement of pain.  May need further imaging to evaluate this, therefore I would recommend following up with your primary care provider or with general surgery if needed.  If pain becomes severe or increased, constant, develop fever, unable to have Bm, nausea, vomiting or otherwise worsening please go to the Er.

## 2018-09-14 NOTE — ED Triage Notes (Signed)
Pt states she thinks he pulled a muscle. Pt states  He was lifting his trailer of the hitch and muscle a stomach muscle. X 3days

## 2018-09-26 ENCOUNTER — Encounter (HOSPITAL_COMMUNITY): Payer: Self-pay | Admitting: Pharmacy Technician

## 2018-09-26 ENCOUNTER — Inpatient Hospital Stay (HOSPITAL_COMMUNITY)
Admission: EM | Admit: 2018-09-26 | Discharge: 2018-09-30 | DRG: 392 | Disposition: A | Discharge status: Home / Self Care | Payer: BLUE CROSS/BLUE SHIELD | Attending: General Surgery | Admitting: General Surgery

## 2018-09-26 ENCOUNTER — Ambulatory Visit: Payer: Self-pay | Admitting: *Deleted

## 2018-09-26 ENCOUNTER — Emergency Department (HOSPITAL_COMMUNITY): Payer: BLUE CROSS/BLUE SHIELD

## 2018-09-26 ENCOUNTER — Other Ambulatory Visit: Payer: Self-pay

## 2018-09-26 DIAGNOSIS — F17210 Nicotine dependence, cigarettes, uncomplicated: Secondary | ICD-10-CM | POA: Diagnosis present

## 2018-09-26 DIAGNOSIS — K5780 Diverticulitis of intestine, part unspecified, with perforation and abscess without bleeding: Secondary | ICD-10-CM

## 2018-09-26 DIAGNOSIS — K631 Perforation of intestine (nontraumatic): Secondary | ICD-10-CM

## 2018-09-26 DIAGNOSIS — Z23 Encounter for immunization: Secondary | ICD-10-CM | POA: Diagnosis not present

## 2018-09-26 DIAGNOSIS — K5720 Diverticulitis of large intestine with perforation and abscess without bleeding: Principal | ICD-10-CM | POA: Diagnosis present

## 2018-09-26 DIAGNOSIS — K651 Peritoneal abscess: Secondary | ICD-10-CM

## 2018-09-26 DIAGNOSIS — K5700 Diverticulitis of small intestine with perforation and abscess without bleeding: Secondary | ICD-10-CM

## 2018-09-26 LAB — URINALYSIS, ROUTINE W REFLEX MICROSCOPIC
BACTERIA UA: NONE SEEN
BILIRUBIN URINE: NEGATIVE
Glucose, UA: NEGATIVE mg/dL
Ketones, ur: NEGATIVE mg/dL
Leukocytes, UA: NEGATIVE
NITRITE: NEGATIVE
Protein, ur: NEGATIVE mg/dL
Specific Gravity, Urine: 1.021 (ref 1.005–1.030)
pH: 6 (ref 5.0–8.0)

## 2018-09-26 LAB — COMPREHENSIVE METABOLIC PANEL
ALBUMIN: 3.4 g/dL — AB (ref 3.5–5.0)
ALK PHOS: 103 U/L (ref 38–126)
ALT: 32 U/L (ref 0–44)
AST: 26 U/L (ref 15–41)
Anion gap: 10 (ref 5–15)
BUN: 12 mg/dL (ref 6–20)
CO2: 28 mmol/L (ref 22–32)
CREATININE: 0.89 mg/dL (ref 0.61–1.24)
Calcium: 9.1 mg/dL (ref 8.9–10.3)
Chloride: 98 mmol/L (ref 98–111)
GFR calc non Af Amer: >60 mL/min (ref >60)
GLUCOSE: 100 mg/dL — AB (ref 70–99)
Potassium: 3.9 mmol/L (ref 3.5–5.1)
SODIUM: 136 mmol/L (ref 135–145)
Total Bilirubin: 0.7 mg/dL (ref 0.3–1.2)
Total Protein: 7.1 g/dL (ref 6.5–8.1)

## 2018-09-26 LAB — CBC
HCT: 43.6 % (ref 39.0–52.0)
HEMOGLOBIN: 14.7 g/dL (ref 13.0–17.0)
MCH: 31.4 pg (ref 26.0–34.0)
MCHC: 33.7 g/dL (ref 30.0–36.0)
MCV: 93.2 fL (ref 80.0–100.0)
NRBC: 0 % (ref 0.0–0.2)
PLATELETS: 266 10*3/uL (ref 150–400)
RBC: 4.68 MIL/uL (ref 4.22–5.81)
RDW: 11.9 % (ref 11.5–15.5)
WBC: 12.4 10*3/uL — AB (ref 4.0–10.5)

## 2018-09-26 LAB — LIPASE, BLOOD: Lipase: 38 U/L (ref 11–51)

## 2018-09-26 MED ORDER — ONDANSETRON HCL 4 MG/2ML IJ SOLN: 4.0000 mg | Freq: Four times a day (QID) | INTRAMUSCULAR | Status: DC | PRN

## 2018-09-26 MED ORDER — PIPERACILLIN-TAZOBACTAM 3.375 G IVPB
3.3750 g | Freq: Once | INTRAVENOUS | Status: AC
  Administered 2018-09-26: 3.375 g via INTRAVENOUS
  Filled 2018-09-26: qty 50

## 2018-09-26 MED ORDER — PIPERACILLIN-TAZOBACTAM 3.375 G IVPB
3.3750 g | Freq: Three times a day (TID) | INTRAVENOUS | Status: DC
  Administered 2018-09-27 – 2018-09-29 (×8): 3.375 g via INTRAVENOUS
  Filled 2018-09-26 (×8): qty 50

## 2018-09-26 MED ORDER — ACETAMINOPHEN 325 MG PO TABS: 650.0000 mg | ORAL_TABLET | Freq: Four times a day (QID) | ORAL | Status: DC | PRN

## 2018-09-26 MED ORDER — MORPHINE SULFATE (PF) 2 MG/ML IV SOLN
2.0000 mg | INTRAVENOUS | Status: DC | PRN
  Administered 2018-09-26 – 2018-09-28 (×5): 2 mg via INTRAVENOUS
  Filled 2018-09-26 (×5): qty 1

## 2018-09-26 MED ORDER — DIPHENHYDRAMINE HCL 50 MG/ML IJ SOLN: 25.0000 mg | Freq: Four times a day (QID) | INTRAMUSCULAR | Status: DC | PRN

## 2018-09-26 MED ORDER — IOHEXOL 300 MG/ML  SOLN
100.0000 mL | Freq: Once | INTRAMUSCULAR | Status: AC | PRN
  Administered 2018-09-26: 100 mL via INTRAVENOUS

## 2018-09-26 MED ORDER — ONDANSETRON 4 MG PO TBDP: 4.0000 mg | ORAL_TABLET | Freq: Four times a day (QID) | ORAL | Status: DC | PRN

## 2018-09-26 MED ORDER — DIPHENHYDRAMINE HCL 25 MG PO CAPS
25.0000 mg | ORAL_CAPSULE | Freq: Four times a day (QID) | ORAL | Status: DC | PRN
  Administered 2018-09-27: 25 mg via ORAL
  Filled 2018-09-26: qty 1

## 2018-09-26 MED ORDER — DEXTROSE-NACL 5-0.45 % IV SOLN
INTRAVENOUS | Status: DC
  Administered 2018-09-26 – 2018-09-27 (×3): via INTRAVENOUS

## 2018-09-26 MED ORDER — OXYCODONE HCL 5 MG PO TABS
5.0000 mg | ORAL_TABLET | Freq: Once | ORAL | Status: AC
  Administered 2018-09-26: 5 mg via ORAL
  Filled 2018-09-26: qty 1

## 2018-09-26 MED ORDER — ACETAMINOPHEN 650 MG RE SUPP: 650.0000 mg | Freq: Four times a day (QID) | RECTAL | Status: DC | PRN

## 2018-09-26 MED ORDER — SODIUM CHLORIDE 0.9 % IV BOLUS
1000.0000 mL | Freq: Once | INTRAVENOUS | Status: AC
  Administered 2018-09-26: 1000 mL via INTRAVENOUS

## 2018-09-26 MED ORDER — METOPROLOL TARTRATE 5 MG/5ML IV SOLN: 5.0000 mg | Freq: Four times a day (QID) | INTRAVENOUS | Status: DC | PRN

## 2018-09-26 MED ORDER — OXYCODONE HCL 5 MG PO TABS
5.0000 mg | ORAL_TABLET | ORAL | Status: DC | PRN
  Administered 2018-09-26 – 2018-09-27 (×4): 5 mg via ORAL
  Filled 2018-09-26 (×4): qty 1

## 2018-09-26 NOTE — ED Notes (Signed)
Pt updated on delays ?
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
## 2018-09-26 NOTE — ED Provider Notes (Signed)
[facility] Emergency Department Provider Note MRN:  [id number]  Arrival date & time: 09/26/18     Chief Complaint   Abdominal Pain   History of Present Illness   Blake Li is a 37 y.o. year-old male with no significant past medical history presenting to the ED with chief complaint of abdominal pain.  Patient explains that he was recently seen in the ED 2 weeks ago for left lower quadrant abdominal pain.  This pain began shortly after trying to lift something very heavy.  He was diagnosed with muscle strain discharge.  He is pleasant this pain improved significantly and was largely resolved.  1 week ago, he began experiencing bilateral lower abdominal pain that seem different, described as a dull pain, associated with pain with urination, constipation.  Patient denies fevers or chills, no headache or vision change, no chest pain or shortness of breath, no upper abdominal pain.  Has been taking MiraLAX with improvement in constipation, but no resolution of pain.  Denies any other trauma, no testicular pain.  Review of Systems  A complete 10 system review of systems was obtained and all systems are negative except as noted in the HPI and PMH.   Patient's Health History   History reviewed. No pertinent past medical history.  Past Surgical History:  Procedure Laterality Date  . FRACTURE SURGERY Left 2002  . ORIF FEMUR FRACTURE Left 2005   titanium rod    Family History  Problem Relation Age of Onset  . Heart failure Father   . Diabetes Father   . Heart disease Father   . Hyperlipidemia Father     Social History   Socioeconomic History  . Marital status: Single    Spouse name: Not on file  . Number of children: Not on file  . Years of education: Not on file  . Highest education level: Not on file  Occupational History  . Not on file  Social Needs  . Financial resource strain: Not on file  . Food insecurity:    Worry: Not on file    Inability: Not on  file  . Transportation needs:    Medical: Not on file    Non-medical: Not on file  Tobacco Use  . Smoking status: Current Every Day Smoker    Packs/day: 1.00    Types: Cigarettes  . Smokeless tobacco: Never Used  Substance and Sexual Activity  . Alcohol use: Yes    Alcohol/week: 12.0 standard drinks    Types: 6 Cans of beer, 6 Shots of liquor per week  . Drug use: Yes    Frequency: 7.0 times per week    Types: Marijuana  . Sexual activity: Not on file  Lifestyle  . Physical activity:    Days per week: Not on file    Minutes per session: Not on file  . Stress: Not on file  Relationships  . Social connections:    Talks on phone: Not on file    Gets together: Not on file    Attends religious service: Not on file    Active member of club or organization: Not on file    Attends meetings of clubs or organizations: Not on file    Relationship status: Not on file  . Intimate partner violence:    Fear of current or ex partner: Not on file    Emotionally abused: Not on file    Physically abused: Not on file    Forced sexual activity: Not on file  Other Topics Concern  . Not on file  Social History Narrative  . Not on file     Physical Exam  Vital Signs and Nursing Notes reviewed Vitals:   09/26/18 2130 09/26/18 2300  BP: 114/75 108/72  Pulse: 92 86  Resp:    Temp:    SpO2: 95% 96%    CONSTITUTIONAL: Well-appearing, NAD NEURO:  Alert and oriented x 3, no focal deficits EYES:  eyes equal and reactive ENT/NECK:  no LAD, no JVD CARDIO: Regular rate, well-perfused, normal S1 and S2 PULM:  CTAB no wheezing or rhonchi GI/GU:  normal bowel sounds, non-distended, non-tender MSK/SPINE:  No gross deformities, no edema SKIN:  no rash, atraumatic PSYCH:  Appropriate speech and behavior  Diagnostic and Interventional Summary    Labs Reviewed  CBC - Abnormal; Notable for the following components:      Result Value   WBC 12.4 (*)    All other components within normal limits   COMPREHENSIVE METABOLIC PANEL - Abnormal; Notable for the following components:   Glucose, Bld 100 (*)    Albumin 3.4 (*)    All other components within normal limits  URINALYSIS, ROUTINE W REFLEX MICROSCOPIC - Abnormal; Notable for the following components:   Hgb urine dipstick SMALL (*)    All other components within normal limits  LIPASE, BLOOD  PROTIME-INR  HIV ANTIBODY (ROUTINE TESTING W REFLEX)  COMPREHENSIVE METABOLIC PANEL  CBC    CT ABDOMEN PELVIS W CONTRAST  Final Result  Addendum 1 of 1  ADDENDUM REPORT: 09/26/2018 19:35    ADDENDUM:  Study discussed by telephone with Dr. Kennis CarinaMICHAEL Janani Chamber on 09/26/2018 at  1929 hours.      Electronically Signed    By: Odessa FlemingH  Hall M.D.    On: 09/26/2018 19:35      Final      Medications  dextrose 5 %-0.45 % sodium chloride infusion ( Intravenous New Bag/Given 09/26/18 2136)  acetaminophen (TYLENOL) tablet 650 mg (has no administration in time range)    Or  acetaminophen (TYLENOL) suppository 650 mg (has no administration in time range)  oxyCODONE (Oxy IR/ROXICODONE) immediate release tablet 5 mg (5 mg Oral Given 09/26/18 2024)  morphine 2 MG/ML injection 2 mg (2 mg Intravenous Given 09/26/18 2256)  ondansetron (ZOFRAN-ODT) disintegrating tablet 4 mg (has no administration in time range)    Or  ondansetron (ZOFRAN) injection 4 mg (has no administration in time range)  metoprolol tartrate (LOPRESSOR) injection 5 mg (has no administration in time range)  piperacillin-tazobactam (ZOSYN) IVPB 3.375 g (has no administration in time range)  diphenhydrAMINE (BENADRYL) capsule 25 mg (has no administration in time range)    Or  diphenhydrAMINE (BENADRYL) injection 25 mg (has no administration in time range)  oxyCODONE (Oxy IR/ROXICODONE) immediate release tablet 5 mg (5 mg Oral Given 09/26/18 1537)  iohexol (OMNIPAQUE) 300 MG/ML solution 100 mL (100 mLs Intravenous Contrast Given 09/26/18 1834)  piperacillin-tazobactam (ZOSYN) IVPB 3.375 g  (0 g Intravenous Stopped 09/26/18 2103)  sodium chloride 0.9 % bolus 1,000 mL (0 mLs Intravenous Stopped 09/26/18 2129)     Procedures Critical Care Critical Care Documentation Critical care time provided by me (excluding procedures): 33 minutes  Condition necessitating critical care: Perforated sigmoid colon, intra-abdominal abscess  Components of critical care management: reviewing of prior records, laboratory and imaging interpretation, frequent re-examination and reassessment of vital signs, administration of IV fluid resuscitation, IV antibiotics, discussion with consulting services    ED Course and Medical Decision Making  I  have reviewed the triage vital signs and the nursing notes.  Pertinent labs & imaging results that were available during my care of the patient were reviewed by me and considered in my medical decision making (see below for details).  Considering intra-abdominal hernia as the cause of the patient's pain today, nonpalpable on exam.  Also considering kidney stone given the abdominal pain as well as difficulty with urination.  Will pursue CT imaging.  CT reveals perforated sigmoid colon, intra-abdominal abscess.  Given IV antibiotics, IV fluids, general surgery consulted, admitted to general surgery for further care and evaluation.  Elmer Sow. Pilar Plate, MD [facility] Emergency Medicine [facility] [email or web address]  Final Clinical Impressions(s) / ED Diagnoses     ICD-10-CM   1. Perforated sigmoid colon (HCC) K63.1   2. Intra-abdominal abscess [facility]) K65.1     ED Discharge Orders    None         Sabas Sous, MD 09/26/18 [phone]

## 2018-09-26 NOTE — Telephone Encounter (Signed)
Abd is not tender to touch or swollen per patient.

## 2018-09-26 NOTE — ED Notes (Signed)
Registration at bedside.

## 2018-09-26 NOTE — ED Triage Notes (Signed)
Pt reports bil lower abd pain along with dysuria onset 5 days ago. Pt states he thought he was constipated and that was the cause of pain so started taking miralax. Now pt having diarrhea X2 days with no relief of pain.

## 2018-09-26 NOTE — ED Notes (Signed)
Called lab to follow up on results.  States blood has not been run yet.  WIll pull and run now.

## 2018-09-26 NOTE — ED Notes (Signed)
Pt back from CT

## 2018-09-26 NOTE — Telephone Encounter (Signed)
Pt called with having abd pain since last week. He had had a pulled muscle and gone to an urgent care on 09/14/18. But he states that this is different. It is his lower abd and he feels constipated. He is taking miralax and did have a small bm today. It was not black or any blood with it. He also states that it hurts to urinate or to bend. Denies flank pain or blood in urine. Denies fever. No nausea or vomiting. Recommend that he be seen at an urgent care (no available appointments today). Per protocol to be seen within 4 hours of triaging. Pt did not want to go to an urgent but requesting an appointment. He feels like the wait would be too long. Pt disconnected the call. Routing to flow at PCP.  Reason for Disposition . [1] MILD-MODERATE pain AND [2] constant AND [3] present > 2 hours  Answer Assessment - Initial Assessment Questions 1. LOCATION: "Where does it hurt?"      Lower abd 2. RADIATION: "Does the pain shoot anywhere else?" (e.g., chest, back)     no 3. ONSET: "When did the pain begin?" (Minutes, hours or days ago)      Last week 4. SUDDEN: "Gradual or sudden onset?"     gradual 5. PATTERN "Does the pain come and go, or is it constant?"    - If constant: "Is it getting better, staying the same, or worsening?"      (Note: Constant means the pain never goes away completely; most serious pain is constant and it progresses)     - If intermittent: "How long does it last?" "Do you have pain now?"     (Note: Intermittent means the pain goes away completely between bouts)     constant 6. SEVERITY: "How bad is the pain?"  (e.g., Scale 1-10; mild, moderate, or severe)    - MILD (1-3): doesn't interfere with normal activities, abdomen soft and not tender to touch     - MODERATE (4-7): interferes with normal activities or awakens from sleep, tender to touch     - SEVERE (8-10): excruciating pain, doubled over, unable to do any normal activities       Pain # 5 7. RECURRENT SYMPTOM: "Have you  ever had this type of abdominal pain before?" If so, ask: "When was the last time?" and "What happened that time?"      no 8. CAUSE: "What do you think is causing the abdominal pain?"     Not sure 9. RELIEVING/AGGRAVATING FACTORS: "What makes it better or worse?" (e.g., movement, antacids, bowel movement)     Bend it makes it worst, even urinate or have a bowel movement 10. OTHER SYMPTOMS: "Has there been any vomiting, diarrhea, constipation, or urine problems?"       constipation  Protocols used: ABDOMINAL PAIN - MALE-A-AH

## 2018-09-27 ENCOUNTER — Other Ambulatory Visit: Payer: Self-pay

## 2018-09-27 ENCOUNTER — Inpatient Hospital Stay (HOSPITAL_COMMUNITY): Payer: BLUE CROSS/BLUE SHIELD

## 2018-09-27 ENCOUNTER — Encounter (HOSPITAL_COMMUNITY): Payer: Self-pay

## 2018-09-27 LAB — COMPREHENSIVE METABOLIC PANEL
ALBUMIN: 2.9 g/dL — AB (ref 3.5–5.0)
ALK PHOS: 81 U/L (ref 38–126)
ALT: 26 U/L (ref 0–44)
AST: 19 U/L (ref 15–41)
Anion gap: 7 (ref 5–15)
BILIRUBIN TOTAL: 0.7 mg/dL (ref 0.3–1.2)
BUN: 9 mg/dL (ref 6–20)
CO2: 24 mmol/L (ref 22–32)
Calcium: 8.3 mg/dL — ABNORMAL LOW (ref 8.9–10.3)
Chloride: 104 mmol/L (ref 98–111)
Creatinine, Ser: 0.81 mg/dL (ref 0.61–1.24)
GFR calc Af Amer: >60 mL/min (ref >60)
GFR calc non Af Amer: >60 mL/min (ref >60)
GLUCOSE: 102 mg/dL — AB (ref 70–99)
POTASSIUM: 3.8 mmol/L (ref 3.5–5.1)
SODIUM: 135 mmol/L (ref 135–145)
TOTAL PROTEIN: 6 g/dL — AB (ref 6.5–8.1)

## 2018-09-27 LAB — CBC
HCT: 38.8 % — ABNORMAL LOW (ref 39.0–52.0)
HEMOGLOBIN: 12.9 g/dL — AB (ref 13.0–17.0)
MCH: 30.8 pg (ref 26.0–34.0)
MCHC: 33.2 g/dL (ref 30.0–36.0)
MCV: 92.6 fL (ref 80.0–100.0)
Platelets: 229 10*3/uL (ref 150–400)
RBC: 4.19 MIL/uL — AB (ref 4.22–5.81)
RDW: 11.9 % (ref 11.5–15.5)
WBC: 11.4 10*3/uL — ABNORMAL HIGH (ref 4.0–10.5)
nRBC: 0 % (ref 0.0–0.2)

## 2018-09-27 LAB — PROTIME-INR
INR: 0.97
Prothrombin Time: 12.8 seconds (ref 11.4–15.2)

## 2018-09-27 LAB — HIV ANTIBODY (ROUTINE TESTING W REFLEX): HIV Screen 4th Generation wRfx: NONREACTIVE

## 2018-09-27 MED ORDER — MIDAZOLAM HCL 2 MG/2ML IJ SOLN
INTRAMUSCULAR | Status: AC
  Filled 2018-09-27: qty 2

## 2018-09-27 MED ORDER — FENTANYL CITRATE (PF) 100 MCG/2ML IJ SOLN
INTRAMUSCULAR | Status: DC | PRN
  Administered 2018-09-27: 50 ug via INTRAVENOUS
  Administered 2018-09-27: 25 ug via INTRAVENOUS

## 2018-09-27 MED ORDER — INFLUENZA VAC SPLIT QUAD 0.5 ML IM SUSY
0.5000 mL | PREFILLED_SYRINGE | INTRAMUSCULAR | Status: AC
  Administered 2018-09-30: 0.5 mL via INTRAMUSCULAR
  Filled 2018-09-27: qty 0.5

## 2018-09-27 MED ORDER — FENTANYL CITRATE (PF) 100 MCG/2ML IJ SOLN
INTRAMUSCULAR | Status: AC
  Filled 2018-09-27: qty 2

## 2018-09-27 MED ORDER — LIDOCAINE HCL 1 % IJ SOLN
INTRAMUSCULAR | Status: AC
  Filled 2018-09-27: qty 20

## 2018-09-27 MED ORDER — SODIUM CHLORIDE 0.9% FLUSH
5.0000 mL | Freq: Three times a day (TID) | INTRAVENOUS | Status: DC
  Administered 2018-09-27 – 2018-09-30 (×9): 5 mL

## 2018-09-27 MED ORDER — MIDAZOLAM HCL 2 MG/2ML IJ SOLN
INTRAMUSCULAR | Status: DC | PRN
  Administered 2018-09-27: 1 mg via INTRAVENOUS
  Administered 2018-09-27: 0.5 mg via INTRAVENOUS

## 2018-09-27 NOTE — Progress Notes (Signed)
[facility] Progress Note     Subjective: CC-  Feels about the same as yesterday. States that he continues to have lower abdominal pain. Denies n/v.  IR to see today for possible perc drain placement.  Prior h/o splenectomy.  Objective: Vital signs in last 24 hours: Temp:  [99.2 F (37.3 C)-99.3 F (37.4 C)] 99.3 F (37.4 C) (11/16 0402) Pulse Rate:  [76-102] 80 (11/16 0402) Resp:  [16-20] 20 (11/16 0402) BP: (100-134)/(64-87) 121/80 (11/16 0402) SpO2:  [92 %-100 %] 95 % (11/16 0402) Weight:  [85.2 kg-88.5 kg] 85.2 kg (11/16 0402) Last BM Date: 09/26/18  Intake/Output from previous day: 11/15 0701 - 11/16 0700 In: 819.2 [I.V.:819.2] Out: -  Intake/Output this shift: No intake/output data recorded.  PE: Gen:  Alert, NAD, pleasant HEENT: EOM's intact, pupils equal and round Card:  RRR Pulm:  CTAB, no W/R/R, effort normal Abd: Soft, MD, +BS, no HSM, no hernia, mod TTP lower quadrants without rebound or guarding Ext:  Calves soft and nontender Psych: A&Ox3  Skin: no rashes noted, warm and dry  Lab Results:  Recent Labs    09/26/18 1737  WBC 12.4*  HGB 14.7  HCT 43.6  PLT 266   BMET Recent Labs    09/26/18 1737 09/27/18 0248  NA 136 135  K 3.9 3.8  CL 98 104  CO2 28 24  GLUCOSE 100* 102*  BUN 12 9  CREATININE 0.89 0.81  CALCIUM 9.1 8.3*   PT/INR Recent Labs    09/27/18 0248  LABPROT 12.8  INR 0.97   CMP     Component Value Date/Time   NA 135 09/27/2018 0248   NA 144 12/16/2017 1223   K 3.8 09/27/2018 0248   CL 104 09/27/2018 0248   CO2 24 09/27/2018 0248   GLUCOSE 102 (H) 09/27/2018 0248   BUN 9 09/27/2018 0248   BUN 12 12/16/2017 1223   CREATININE 0.81 09/27/2018 0248   CALCIUM 8.3 (L) 09/27/2018 0248   PROT 6.0 (L) 09/27/2018 0248   PROT 7.2 12/16/2017 1223   ALBUMIN 2.9 (L) 09/27/2018 0248   ALBUMIN 4.9 12/16/2017 1223   AST 19 09/27/2018 0248   ALT 26 09/27/2018 0248   ALKPHOS 81 09/27/2018 0248   BILITOT 0.7  09/27/2018 0248   BILITOT 0.5 12/16/2017 1223   GFRNONAA >60 09/27/2018 0248   GFRAA >60 09/27/2018 0248   Lipase     Component Value Date/Time   LIPASE 38 09/26/2018 1737       Studies/Results: Ct Abdomen Pelvis W Contrast  Addendum Date: 09/26/2018   ADDENDUM REPORT: 09/26/2018 19:35 ADDENDUM: Study discussed by telephone with Dr. Kennis Carina on 09/26/2018 at 1929 hours. Electronically Signed   By: Odessa Fleming M.D.   On: 09/26/2018 19:35   Result Date: 09/26/2018 CLINICAL DATA:  37 year old male with abdominal pain and dysuria beginning 5 days ago. Diverticulitis suspected. EXAM: CT ABDOMEN AND PELVIS WITH CONTRAST TECHNIQUE: Multidetector CT imaging of the abdomen and pelvis was performed using the standard protocol following bolus administration of intravenous contrast. CONTRAST:  OMNIPAQUE IOHEXOL 300 MG/ML  SOLN COMPARISON:  CT Abdomen and Pelvis 12/17/2017 and earlier. FINDINGS: Lower chest: Negative.  No pericardial or pleural effusion. Hepatobiliary: Hepatic steatosis is less apparent. Negative liver and gallbladder. Pancreas: Negative. Spleen: Negative. Adrenals/Urinary Tract: Normal adrenal glands. Bilateral renal enhancement is symmetric and normal. No hydroureter. Secondary inflammation along the course of the left ureter particularly where it crosses the pelvic inlet. Inflammation over the bladder  dome, but the bladder remains within normal limits. Stomach/Bowel: There is a contained perforation with abscess in the mesentery of the sigmoid colon encompassing 37 x 35 x 73 millimeters (AP by transverse by CC) for an estimated abscess volume of 47 milliliters. Surrounding inflammation and phlegmon located near the bladder dome (sagittal image 69). Inflammation involves the course of the left ureter. Regional wall thickening of the sigmoid colon, throughout its course. There are relatively few diverticula. The rectum and descending colon appear normal. Negative transverse colon  aside from redundancy. Negative right colon and appendix. Negative terminal ileum. Mild secondary involvement of lower abdominal small bowel by the sigmoid mesenteric abnormality. No dilated small bowel. Decompressed stomach and duodenum. No free intraperitoneal air or fluid. Vascular/Lymphatic: Major arterial structures are patent and appear normal. Chronic IVC filter appears unchanged. Portal venous system is patent. No lymphadenopathy. Reproductive: Negative. Other: No pelvic free fluid. Musculoskeletal: No acute osseous abnormality identified. Partially visible previous proximal left femur ORIF. IMPRESSION: 1. Contained Perforation Of Bowel in the sigmoid mesentery with abscess and surrounding phlegmon. Superimposed sigmoid colitis. Estimated abscess volume of 47 mL. 2. No associated bowel obstruction. No free intraperitoneal air or fluid. 3. Secondary inflammation of distal abdominal small bowel, the course of the left ureter, and to a lesser extent bladder dome. Electronically Signed: By: Odessa FlemingH  Hall M.D. On: 09/26/2018 19:25    Anti-infectives: Anti-infectives (From admission, onward)   Start     Dose/Rate Route Frequency Ordered Stop   09/27/18 0200  piperacillin-tazobactam (ZOSYN) IVPB 3.375 g     3.375 g 12.5 mL/hr over 240 Minutes Intravenous Every 8 hours 09/26/18 2012     09/26/18 1945  piperacillin-tazobactam (ZOSYN) IVPB 3.375 g     3.375 g 100 mL/hr over 30 Minutes Intravenous  Once 09/26/18 1943 09/26/18 2103       Assessment/Plan Tobacco use H/o splenectomy  Perforated diverticulitis with abscess - first bout of diverticulitis - CT 11/15 showed contained perforation Of Bowel in the sigmoid mesentery with abscess and surrounding phlegmon  ID - zosyn 11/15>> FEN - IVF, NPO VTE - SCDs, lovenox on hold for possible procedure Foley - none Follow up - TBD  Plan - CBC pending. Continue IV zosyn and bowel rest. IR consult for possible perc drain placement.   LOS: 1 day     Franne FortsBrooke A Meuth , [facility] 09/27/2018, 8:15 AM Pager: [phone] Mon 7:00 am -11:30 AM Tues-Fri 7:00 am-4:30 pm Sat-Sun 7:00 am-11:30 am

## 2018-09-27 NOTE — Progress Notes (Signed)
Chief Complaint: Patient was seen in consultation today for abdominal abscess at the request of Carlena Bjornstad PA-C  Referring Physician(s): Carlena Bjornstad PA-C  Supervising Physician: Richarda Overlie  Patient Status: [facility] - In-pt  History of Present Illness: Blake Li is a 37 y.o. male admitted with diverticulitis and found to have associated abscess on his CT. IR is asked to place perc drain. PMHx, meds, labs, imaging, allergies reviewed. Feels well, no recent fevers, chills, illness. Has been NPO today as directed.   History reviewed. No pertinent past medical history.  Past Surgical History:  Procedure Laterality Date  . FRACTURE SURGERY Left 2002  . ORIF FEMUR FRACTURE Left 2005   titanium rod    Allergies: Patient has no known allergies.  Medications:  Current Facility-Administered Medications:  .  acetaminophen (TYLENOL) tablet 650 mg, 650 mg, Oral, Q6H PRN **OR** acetaminophen (TYLENOL) suppository 650 mg, 650 mg, Rectal, Q6H PRN, Kinsinger, De Blanch, MD .  dextrose 5 %-0.45 % sodium chloride infusion, , Intravenous, Continuous, Kinsinger, De Blanch, MD, Last Rate: 100 mL/hr at 09/27/18 0800 .  diphenhydrAMINE (BENADRYL) capsule 25 mg, 25 mg, Oral, Q6H PRN **OR** diphenhydrAMINE (BENADRYL) injection 25 mg, 25 mg, Intravenous, Q6H PRN, Kinsinger, De Blanch, MD .  metoprolol tartrate (LOPRESSOR) injection 5 mg, 5 mg, Intravenous, Q6H PRN, Kinsinger, De Blanch, MD .  morphine 2 MG/ML injection 2 mg, 2 mg, Intravenous, Q2H PRN, Kinsinger, De Blanch, MD, 2 mg at 09/27/18 0752 .  ondansetron (ZOFRAN-ODT) disintegrating tablet 4 mg, 4 mg, Oral, Q6H PRN **OR** ondansetron (ZOFRAN) injection 4 mg, 4 mg, Intravenous, Q6H PRN, Kinsinger, De Blanch, MD .  oxyCODONE (Oxy IR/ROXICODONE) immediate release tablet 5 mg, 5 mg, Oral, Q4H PRN, Kinsinger, De Blanch, MD, 5 mg at 09/27/18 0204 .  piperacillin-tazobactam (ZOSYN) IVPB 3.375 g, 3.375 g, Intravenous, Q8H, Kinsinger,  De Blanch, MD, Last Rate: 12.5 mL/hr at 09/27/18 0243, 3.375 g at 09/27/18 0243    Family History  Problem Relation Age of Onset  . Heart failure Father   . Diabetes Father   . Heart disease Father   . Hyperlipidemia Father     Social History   Socioeconomic History  . Marital status: Single    Spouse name: Not on file  . Number of children: Not on file  . Years of education: Not on file  . Highest education level: Not on file  Occupational History  . Not on file  Social Needs  . Financial resource strain: Not on file  . Food insecurity:    Worry: Not on file    Inability: Not on file  . Transportation needs:    Medical: Not on file    Non-medical: Not on file  Tobacco Use  . Smoking status: Current Every Day Smoker    Packs/day: 1.00    Types: Cigarettes  . Smokeless tobacco: Never Used  Substance and Sexual Activity  . Alcohol use: Yes    Alcohol/week: 12.0 standard drinks    Types: 6 Cans of beer, 6 Shots of liquor per week  . Drug use: Yes    Frequency: 7.0 times per week    Types: Marijuana  . Sexual activity: Not on file  Lifestyle  . Physical activity:    Days per week: Not on file    Minutes per session: Not on file  . Stress: Not on file  Relationships  . Social connections:    Talks on phone: Not on file    Gets together: Not  on file    Attends religious service: Not on file    Active member of club or organization: Not on file    Attends meetings of clubs or organizations: Not on file    Relationship status: Not on file  Other Topics Concern  . Not on file  Social History Narrative  . Not on file     Review of Systems: A 12 point ROS discussed and pertinent positives are indicated in the HPI above.  All other systems are negative.  Review of Systems  Vital Signs: BP 102/89 (BP Location: Right Arm)   Pulse 98   Temp 99.1 F (37.3 C) (Oral)   Resp 17   Ht 5\' 6"  (1.676 m)   Wt 85.2 kg   SpO2 94%   BMI 30.31 kg/m   Physical Exam    Constitutional: He is oriented to person, place, and time. He appears well-developed. No distress.  HENT:  Head: Normocephalic.  Mouth/Throat: Oropharynx is clear and moist.  Neck: Normal range of motion. No JVD present.  Cardiovascular: Normal rate, regular rhythm and normal heart sounds.  Pulmonary/Chest: Effort normal and breath sounds normal. No respiratory distress.  Abdominal: Soft. There is tenderness. There is no guarding.  Neurological: He is alert and oriented to person, place, and time.  Skin: Skin is warm and dry.  Psychiatric: He has a normal mood and affect.     Imaging: Ct Abdomen Pelvis W Contrast  Addendum Date: 09/26/2018   ADDENDUM REPORT: 09/26/2018 19:35 ADDENDUM: Study discussed by telephone with Dr. Kennis CarinaMICHAEL BERO on 09/26/2018 at 1929 hours. Electronically Signed   By: Odessa FlemingH  Hall M.D.   On: 09/26/2018 19:35   Result Date: 09/26/2018 CLINICAL DATA:  37 year old male with abdominal pain and dysuria beginning 5 days ago. Diverticulitis suspected. EXAM: CT ABDOMEN AND PELVIS WITH CONTRAST TECHNIQUE: Multidetector CT imaging of the abdomen and pelvis was performed using the standard protocol following bolus administration of intravenous contrast. CONTRAST:  100mL OMNIPAQUE IOHEXOL 300 MG/ML  SOLN COMPARISON:  CT Abdomen and Pelvis 12/17/2017 and earlier. FINDINGS: Lower chest: Negative.  No pericardial or pleural effusion. Hepatobiliary: Hepatic steatosis is less apparent. Negative liver and gallbladder. Pancreas: Negative. Spleen: Negative. Adrenals/Urinary Tract: Normal adrenal glands. Bilateral renal enhancement is symmetric and normal. No hydroureter. Secondary inflammation along the course of the left ureter particularly where it crosses the pelvic inlet. Inflammation over the bladder dome, but the bladder remains within normal limits. Stomach/Bowel: There is a contained perforation with abscess in the mesentery of the sigmoid colon encompassing 37 x 35 x 73 millimeters (AP  by transverse by CC) for an estimated abscess volume of 47 milliliters. Surrounding inflammation and phlegmon located near the bladder dome (sagittal image 69). Inflammation involves the course of the left ureter. Regional wall thickening of the sigmoid colon, throughout its course. There are relatively few diverticula. The rectum and descending colon appear normal. Negative transverse colon aside from redundancy. Negative right colon and appendix. Negative terminal ileum. Mild secondary involvement of lower abdominal small bowel by the sigmoid mesenteric abnormality. No dilated small bowel. Decompressed stomach and duodenum. No free intraperitoneal air or fluid. Vascular/Lymphatic: Major arterial structures are patent and appear normal. Chronic IVC filter appears unchanged. Portal venous system is patent. No lymphadenopathy. Reproductive: Negative. Other: No pelvic free fluid. Musculoskeletal: No acute osseous abnormality identified. Partially visible previous proximal left femur ORIF. IMPRESSION: 1. Contained Perforation Of Bowel in the sigmoid mesentery with abscess and surrounding phlegmon. Superimposed sigmoid colitis. Estimated abscess  volume of 47 mL. 2. No associated bowel obstruction. No free intraperitoneal air or fluid. 3. Secondary inflammation of distal abdominal small bowel, the course of the left ureter, and to a lesser extent bladder dome. Electronically Signed: By: Odessa Fleming M.D. On: 09/26/2018 19:25    Labs:  CBC: Recent Labs    12/16/17 1217 12/17/17 1719 09/26/18 1737  WBC 10.6* 13.2* 12.4*  HGB 16.8 18.8* 14.7  HCT 50.1 51.4 43.6  PLT  --  218 266    COAGS: Recent Labs    09/27/18 0248  INR 0.97    BMP: Recent Labs    12/16/17 1223 12/17/17 1719 09/26/18 1737 09/27/18 0248  NA 144 137 136 135  K 3.7 2.9* 3.9 3.8  CL 94* 90* 98 104  CO2 30* 31 28 24   GLUCOSE 128* 122* 100* 102*  BUN 12 16 12 9   CALCIUM 11.4* 11.7* 9.1 8.3*  CREATININE 0.87 1.20 0.89 0.81    GFRNONAA 112 >60 >60 >60  GFRAA 129 >60 >60 >60    LIVER FUNCTION TESTS: Recent Labs    12/16/17 1223 12/17/17 1719 09/26/18 1737 09/27/18 0248  BILITOT 0.5 0.9 0.7 0.7  AST 28 32 26 19  ALT 52* 48 32 26  ALKPHOS 82 76 103 81  PROT 7.2 7.8 7.1 6.0*  ALBUMIN 4.9 4.8 3.4* 2.9*    TUMOR MARKERS: No results for input(s): AFPTM, CEA, CA199, CHROMGRNA in the last 8760 hours.  Assessment and Plan: Diverticular abscess. CT reviewed with Dr. Lowella Dandy, amenable to perc drain. Labs reviewed. Has been NPO Risks and benefits discussed with the patient including bleeding, infection, damage to adjacent structures, bowel perforation/fistula connection, and sepsis.  All of the patient's questions were answered, patient is agreeable to proceed. Consent signed and in chart.    Thank you for this interesting consult.  I greatly enjoyed meeting Blake Li and look forward to participating in their care.  A copy of this report was sent to the requesting provider on this date.  Electronically Signed: Brayton El, PA-C 09/27/2018, 8:43 AM   I spent a total of 25 minutes in face to face in clinical consultation, greater than 50% of which was counseling/coordinating care for diverticular abscess drain

## 2018-09-27 NOTE — Procedures (Signed)
CT guided drainage of colonic diverticular abscess.  10 Fr drain placed and 30 ml of bloody purulent fluid aspirated.  Minimal blood loss and no immediate complication.  See full report in IMAGING.

## 2018-09-27 NOTE — Plan of Care (Signed)
  Problem: Activity: Goal: Risk for activity intolerance will decrease Outcome: Progressing   Problem: Safety: Goal: Ability to remain free from injury will improve Outcome: Progressing   

## 2018-09-28 LAB — COMPREHENSIVE METABOLIC PANEL
ALBUMIN: 2.8 g/dL — AB (ref 3.5–5.0)
ALT: 24 U/L (ref 0–44)
AST: 17 U/L (ref 15–41)
Alkaline Phosphatase: 74 U/L (ref 38–126)
Anion gap: 8 (ref 5–15)
BUN: 8 mg/dL (ref 6–20)
CO2: 28 mmol/L (ref 22–32)
CREATININE: 1.12 mg/dL (ref 0.61–1.24)
Calcium: 8.7 mg/dL — ABNORMAL LOW (ref 8.9–10.3)
Chloride: 102 mmol/L (ref 98–111)
GFR calc Af Amer: >60 mL/min (ref >60)
GFR calc non Af Amer: >60 mL/min (ref >60)
GLUCOSE: 103 mg/dL — AB (ref 70–99)
POTASSIUM: 3.8 mmol/L (ref 3.5–5.1)
Sodium: 138 mmol/L (ref 135–145)
TOTAL PROTEIN: 6 g/dL — AB (ref 6.5–8.1)
Total Bilirubin: 0.8 mg/dL (ref 0.3–1.2)

## 2018-09-28 LAB — CBC
HCT: 38.3 % — ABNORMAL LOW (ref 39.0–52.0)
HEMOGLOBIN: 13 g/dL (ref 13.0–17.0)
MCH: 31 pg (ref 26.0–34.0)
MCHC: 33.9 g/dL (ref 30.0–36.0)
MCV: 91.2 fL (ref 80.0–100.0)
Platelets: 229 10*3/uL (ref 150–400)
RBC: 4.2 MIL/uL — ABNORMAL LOW (ref 4.22–5.81)
RDW: 11.6 % (ref 11.5–15.5)
WBC: 7.2 10*3/uL (ref 4.0–10.5)
nRBC: 0 % (ref 0.0–0.2)

## 2018-09-28 MED ORDER — ENOXAPARIN SODIUM 40 MG/0.4ML ~~LOC~~ SOLN
40.0000 mg | SUBCUTANEOUS | Status: DC
  Administered 2018-09-28 – 2018-09-30 (×3): 40 mg via SUBCUTANEOUS
  Filled 2018-09-28 (×3): qty 0.4

## 2018-09-28 NOTE — Progress Notes (Signed)
[facility] Progress Note     Subjective: CC-  Sitting up in bed sipping on liquids. States that he is sore from drain placement, but overall feeling somewhat better. Tolerating clear liquids. Denies n/v. No BM. WBC trending down 7.2, afebrile.  Objective: Vital signs in last 24 hours: Temp:  [98.8 F (37.1 C)-99.2 F (37.3 C)] 98.8 F (37.1 C) (11/17 0624) Pulse Rate:  [73-82] 73 (11/17 0624) Resp:  [18-21] 18 (11/17 0624) BP: (108-126)/(66-83) 108/70 (11/17 0624) SpO2:  [95 %-98 %] 98 % (11/17 0624) Weight:  [83.9 kg] 83.9 kg (11/17 0624) Last BM Date: 09/26/18  Intake/Output from previous day: 11/16 0701 - 11/17 0700 In: 3246.6 [P.O.:840; I.V.:2282.3; IV Piggyback:119.3] Out: 2136 [Urine:2033; Drains:103] Intake/Output this shift: Total I/O In: -  Out: 310 [Urine:300; Drains:10]  PE: Gen:  Alert, NAD, pleasant HEENT: EOM's intact, pupils equal and round Card:  RRR Pulm:  CTAB, no W/R/R, effort normal Abd: Soft, ND, +BS, no HSM, no hernia, very mild TTP around drain  - drain with 103cc sanguinous/purulent output Psych: A&Ox3  Skin: no rashes noted, warm and dry  Lab Results:  Recent Labs    09/27/18 0832 09/28/18 0609  WBC 11.4* 7.2  HGB 12.9* 13.0  HCT 38.8* 38.3*  PLT 229 229   BMET Recent Labs    09/27/18 0248 09/28/18 0609  NA 135 138  K 3.8 3.8  CL 104 102  CO2 24 28  GLUCOSE 102* 103*  BUN 9 8  CREATININE 0.81 1.12  CALCIUM 8.3* 8.7*   PT/INR Recent Labs    09/27/18 0248  LABPROT 12.8  INR 0.97   CMP     Component Value Date/Time   NA 138 09/28/2018 0609   NA 144 12/16/2017 1223   K 3.8 09/28/2018 0609   CL 102 09/28/2018 0609   CO2 28 09/28/2018 0609   GLUCOSE 103 (H) 09/28/2018 0609   BUN 8 09/28/2018 0609   BUN 12 12/16/2017 1223   CREATININE 1.12 09/28/2018 0609   CALCIUM 8.7 (L) 09/28/2018 0609   PROT 6.0 (L) 09/28/2018 0609   PROT 7.2 12/16/2017 1223   ALBUMIN 2.8 (L) 09/28/2018 0609   ALBUMIN 4.9  12/16/2017 1223   AST 17 09/28/2018 0609   ALT 24 09/28/2018 0609   ALKPHOS 74 09/28/2018 0609   BILITOT 0.8 09/28/2018 0609   BILITOT 0.5 12/16/2017 1223   GFRNONAA >60 09/28/2018 0609   GFRAA >60 09/28/2018 0609   Lipase     Component Value Date/Time   LIPASE 38 09/26/2018 1737       Studies/Results: Ct Abdomen Pelvis W Contrast  Addendum Date: 09/26/2018   ADDENDUM REPORT: 09/26/2018 19:35 ADDENDUM: Study discussed by telephone with Dr. Kennis Carina on 09/26/2018 at 1929 hours. Electronically Signed   By: Odessa Fleming M.D.   On: 09/26/2018 19:35   Result Date: 09/26/2018 CLINICAL DATA:  37 year old male with abdominal pain and dysuria beginning 5 days ago. Diverticulitis suspected. EXAM: CT ABDOMEN AND PELVIS WITH CONTRAST TECHNIQUE: Multidetector CT imaging of the abdomen and pelvis was performed using the standard protocol following bolus administration of intravenous contrast. CONTRAST:  OMNIPAQUE IOHEXOL 300 MG/ML  SOLN COMPARISON:  CT Abdomen and Pelvis 12/17/2017 and earlier. FINDINGS: Lower chest: Negative.  No pericardial or pleural effusion. Hepatobiliary: Hepatic steatosis is less apparent. Negative liver and gallbladder. Pancreas: Negative. Spleen: Negative. Adrenals/Urinary Tract: Normal adrenal glands. Bilateral renal enhancement is symmetric and normal. No hydroureter. Secondary inflammation along the course of the  left ureter particularly where it crosses the pelvic inlet. Inflammation over the bladder dome, but the bladder remains within normal limits. Stomach/Bowel: There is a contained perforation with abscess in the mesentery of the sigmoid colon encompassing 37 x 35 x 73 millimeters (AP by transverse by CC) for an estimated abscess volume of 47 milliliters. Surrounding inflammation and phlegmon located near the bladder dome (sagittal image 69). Inflammation involves the course of the left ureter. Regional wall thickening of the sigmoid colon, throughout its course.  There are relatively few diverticula. The rectum and descending colon appear normal. Negative transverse colon aside from redundancy. Negative right colon and appendix. Negative terminal ileum. Mild secondary involvement of lower abdominal small bowel by the sigmoid mesenteric abnormality. No dilated small bowel. Decompressed stomach and duodenum. No free intraperitoneal air or fluid. Vascular/Lymphatic: Major arterial structures are patent and appear normal. Chronic IVC filter appears unchanged. Portal venous system is patent. No lymphadenopathy. Reproductive: Negative. Other: No pelvic free fluid. Musculoskeletal: No acute osseous abnormality identified. Partially visible previous proximal left femur ORIF. IMPRESSION: 1. Contained Perforation Of Bowel in the sigmoid mesentery with abscess and surrounding phlegmon. Superimposed sigmoid colitis. Estimated abscess volume of 47 mL. 2. No associated bowel obstruction. No free intraperitoneal air or fluid. 3. Secondary inflammation of distal abdominal small bowel, the course of the left ureter, and to a lesser extent bladder dome. Electronically Signed: By: Odessa Fleming M.D. On: 09/26/2018 19:25   Ct Image Guided Drainage By Percutaneous Catheter  Result Date: 09/27/2018 INDICATION: 37 year old with a pericolonic diverticular abscess. Plan for percutaneous drain placement. EXAM: CT GUIDED DRAINAGE OF PERICOLONIC ABSCESS MEDICATIONS: The patient is currently admitted to the hospital and receiving intravenous antibiotics. ANESTHESIA/SEDATION: 2.0 mg IV Versed 100 mcg IV Fentanyl Moderate Sedation Time:  12 minutes The patient was continuously monitored during the procedure by the interventional radiology nurse under my direct supervision. COMPLICATIONS: None immediate. TECHNIQUE: Informed written consent was obtained from the patient after a thorough discussion of the procedural risks, benefits and alternatives. All questions were addressed. A timeout was performed prior  to the initiation of the procedure. PROCEDURE: Patient was placed supine on the CT scanner with the left side mildly elevated. Images through the lower abdomen were obtained. The pericolonic abscess adjacent to the sigmoid colon was targeted. The left lateral abdomen was prepped and draped in sterile fashion. Maximal barrier sterile technique was utilized including caps, mask, sterile gowns, sterile gloves, sterile drape, hand hygiene and skin antiseptic. Skin and soft tissues anesthetized with 1% lidocaine. 18 gauge trocar needle was directed into the pericolonic abscess with CT guidance. Yellow purulent fluid was aspirated. A stiff Amplatz wire was placed. The tract was dilated to accommodate a 10.2 [place] multipurpose drain. 30 mL of bloody purulent, foul-smelling fluid was removed from the abscess. Follow up CT images were obtained. Catheter was sutured to skin and attached to a suction bulb. FINDINGS: Pericolonic abscess just posterior to the sigmoid colon. 30 mL of purulent fluid was removed. The abscess was decompressed at the end of the procedure. IMPRESSION: CT-guided placement of a drainage catheter within the pericolonic abscess. Electronically Signed   By: Richarda Overlie M.D.   On: 09/27/2018 12:28    Anti-infectives: Anti-infectives (From admission, onward)   Start     Dose/Rate Route Frequency Ordered Stop   09/27/18 0200  piperacillin-tazobactam (ZOSYN) IVPB 3.375 g     3.375 g 12.5 mL/hr over 240 Minutes Intravenous Every 8 hours 09/26/18 2012  09/26/18 1945  piperacillin-tazobactam (ZOSYN) IVPB 3.375 g     3.375 g 100 mL/hr over 30 Minutes Intravenous  Once 09/26/18 1943 09/26/18 2103       Assessment/Plan Tobacco use H/o splenectomy  Perforated diverticulitis with abscess - first bout of diverticulitis - CT 11/15 showed contained perforation Of Bowel in the sigmoid mesentery with abscess and surrounding phlegmon - s/p IR drain 11/16, culture pending  ID - zosyn  11/15>> FEN - IVF, FLD VTE - SCDs, lovenox Foley - none Follow up - TBD  Plan - Advance to full liquids. Continue drain and IV abx. Follow culture. Mobilize.   LOS: 2 days    Franne FortsBrooke A Meuth , [facility] 09/28/2018, 9:07 AM Pager: [phone] Mon 7:00 am -11:30 AM Tues-Fri 7:00 am-4:30 pm Sat-Sun 7:00 am-11:30 am

## 2018-09-28 NOTE — Plan of Care (Signed)
  Problem: Health Behavior/Discharge Planning: Goal: Ability to manage health-related needs will improve Outcome: Progressing   Problem: Activity: Goal: Risk for activity intolerance will decrease Outcome: Progressing   Problem: Nutrition: Goal: Adequate nutrition will be maintained Outcome: Progressing   Problem: Pain Managment: Goal: General experience of comfort will improve Outcome: Progressing   Problem: Safety: Goal: Ability to remain free from injury will improve Outcome: Progressing   

## 2018-09-29 LAB — CBC
HCT: 40.4 % (ref 39.0–52.0)
Hemoglobin: 13.8 g/dL (ref 13.0–17.0)
MCH: 30.7 pg (ref 26.0–34.0)
MCHC: 34.2 g/dL (ref 30.0–36.0)
MCV: 89.8 fL (ref 80.0–100.0)
PLATELETS: 266 10*3/uL (ref 150–400)
RBC: 4.5 MIL/uL (ref 4.22–5.81)
RDW: 11.4 % — AB (ref 11.5–15.5)
WBC: 7.5 10*3/uL (ref 4.0–10.5)
nRBC: 0 % (ref 0.0–0.2)

## 2018-09-29 LAB — COMPREHENSIVE METABOLIC PANEL
ALBUMIN: 3.1 g/dL — AB (ref 3.5–5.0)
ALK PHOS: 78 U/L (ref 38–126)
ALT: 26 U/L (ref 0–44)
ANION GAP: 7 (ref 5–15)
AST: 19 U/L (ref 15–41)
BILIRUBIN TOTAL: 0.8 mg/dL (ref 0.3–1.2)
BUN: 7 mg/dL (ref 6–20)
CO2: 28 mmol/L (ref 22–32)
CREATININE: 1 mg/dL (ref 0.61–1.24)
Calcium: 8.9 mg/dL (ref 8.9–10.3)
Chloride: 102 mmol/L (ref 98–111)
GFR calc Af Amer: >60 mL/min (ref >60)
GFR calc non Af Amer: >60 mL/min (ref >60)
GLUCOSE: 92 mg/dL (ref 70–99)
Potassium: 3.9 mmol/L (ref 3.5–5.1)
SODIUM: 137 mmol/L (ref 135–145)
TOTAL PROTEIN: 6.7 g/dL (ref 6.5–8.1)

## 2018-09-29 MED ORDER — AMOXICILLIN-POT CLAVULANATE 875-125 MG PO TABS
1.0000 | ORAL_TABLET | Freq: Two times a day (BID) | ORAL | Status: DC
  Administered 2018-09-29: 1 via ORAL
  Filled 2018-09-29: qty 1

## 2018-09-29 MED ORDER — SACCHAROMYCES BOULARDII 250 MG PO CAPS
250.0000 mg | ORAL_CAPSULE | Freq: Two times a day (BID) | ORAL | Status: DC
  Administered 2018-09-29 – 2018-09-30 (×3): 250 mg via ORAL
  Filled 2018-09-29 (×3): qty 1

## 2018-09-29 MED ORDER — OXYCODONE HCL 5 MG PO TABS: 5.0000 mg | ORAL_TABLET | Freq: Four times a day (QID) | ORAL | Status: DC | PRN

## 2018-09-29 MED ORDER — MORPHINE SULFATE (PF) 2 MG/ML IV SOLN: 1.0000 mg | INTRAVENOUS | Status: DC | PRN

## 2018-09-29 MED ORDER — CEPHALEXIN 250 MG PO CAPS
500.0000 mg | ORAL_CAPSULE | Freq: Three times a day (TID) | ORAL | Status: DC
  Administered 2018-09-29 – 2018-09-30 (×3): 500 mg via ORAL
  Filled 2018-09-29 (×3): qty 2

## 2018-09-29 NOTE — Progress Notes (Signed)
[facility] Progress Note     Subjective: CC-  States that he continues to feel better each day. Mild abdominal pain only around the drain. Tolerating full liquids. Denies n/v. Had a loose BM this morning. WBC 7.5, afebrile.  Objective: Vital signs in last 24 hours: Temp:  [98.4 F (36.9 C)-99 F (37.2 C)] 98.4 F (36.9 C) (11/18 0643) Pulse Rate:  [60-79] 60 (11/18 0643) Resp:  [18-20] 20 (11/18 0643) BP: (113-123)/(79-87) 119/84 (11/18 0643) SpO2:  [97 %-100 %] 97 % (11/18 0643) Weight:  [82.2 kg] 82.2 kg (11/18 0643) Last BM Date: 09/28/18  Intake/Output from previous day: 11/17 0701 - 11/18 0700 In: 1092.8 [P.O.:480; I.V.:500.3; IV Piggyback:112.5] Out: 1285 [Urine:1275; Drains:10] Intake/Output this shift: Total I/O In: 360 [P.O.:360] Out: -   PE: Gen: Alert, NAD, pleasant HEENT: EOM's intact, pupils equal and round Card: RRR Pulm: CTAB, no W/R/R, effort normal Abd: Soft,ND, +BS, no HSM, no hernia, very mild TTP around drain  - drain with 10cc purulent output Psych: A&Ox3  Skin: no rashes noted, warm and dry  Lab Results:  Recent Labs    09/28/18 0609 09/29/18 0536  WBC 7.2 7.5  HGB 13.0 13.8  HCT 38.3* 40.4  PLT 229 266   BMET Recent Labs    09/28/18 0609 09/29/18 0536  NA 138 137  K 3.8 3.9  CL 102 102  CO2 28 28  GLUCOSE 103* 92  BUN 8 7  CREATININE 1.12 1.00  CALCIUM 8.7* 8.9   PT/INR Recent Labs    09/27/18 0248  LABPROT 12.8  INR 0.97   CMP     Component Value Date/Time   NA 137 09/29/2018 0536   NA 144 12/16/2017 1223   K 3.9 09/29/2018 0536   CL 102 09/29/2018 0536   CO2 28 09/29/2018 0536   GLUCOSE 92 09/29/2018 0536   BUN 7 09/29/2018 0536   BUN 12 12/16/2017 1223   CREATININE 1.00 09/29/2018 0536   CALCIUM 8.9 09/29/2018 0536   PROT 6.7 09/29/2018 0536   PROT 7.2 12/16/2017 1223   ALBUMIN 3.1 (L) 09/29/2018 0536   ALBUMIN 4.9 12/16/2017 1223   AST 19 09/29/2018 0536   ALT 26 09/29/2018 0536    ALKPHOS 78 09/29/2018 0536   BILITOT 0.8 09/29/2018 0536   BILITOT 0.5 12/16/2017 1223   GFRNONAA >60 09/29/2018 0536   GFRAA >60 09/29/2018 0536   Lipase     Component Value Date/Time   LIPASE 38 09/26/2018 1737       Studies/Results: Ct Image Guided Drainage By Percutaneous Catheter  Result Date: 09/27/2018 INDICATION: 37 year old with a pericolonic diverticular abscess. Plan for percutaneous drain placement. EXAM: CT GUIDED DRAINAGE OF PERICOLONIC ABSCESS MEDICATIONS: The patient is currently admitted to the hospital and receiving intravenous antibiotics. ANESTHESIA/SEDATION: 2.0 mg IV Versed 100 mcg IV Fentanyl Moderate Sedation Time:  12 minutes The patient was continuously monitored during the procedure by the interventional radiology nurse under my direct supervision. COMPLICATIONS: None immediate. TECHNIQUE: Informed written consent was obtained from the patient after a thorough discussion of the procedural risks, benefits and alternatives. All questions were addressed. A timeout was performed prior to the initiation of the procedure. PROCEDURE: Patient was placed supine on the CT scanner with the left side mildly elevated. Images through the lower abdomen were obtained. The pericolonic abscess adjacent to the sigmoid colon was targeted. The left lateral abdomen was prepped and draped in sterile fashion. Maximal barrier sterile technique was utilized including caps, mask,  sterile gowns, sterile gloves, sterile drape, hand hygiene and skin antiseptic. Skin and soft tissues anesthetized with 1% lidocaine. 18 gauge trocar needle was directed into the pericolonic abscess with CT guidance. Yellow purulent fluid was aspirated. A stiff Amplatz wire was placed. The tract was dilated to accommodate a 10.2 [place] multipurpose drain. 30 mL of bloody purulent, foul-smelling fluid was removed from the abscess. Follow up CT images were obtained. Catheter was sutured to skin and attached to a suction  bulb. FINDINGS: Pericolonic abscess just posterior to the sigmoid colon. 30 mL of purulent fluid was removed. The abscess was decompressed at the end of the procedure. IMPRESSION: CT-guided placement of a drainage catheter within the pericolonic abscess. Electronically Signed   By: Richarda Overlie M.D.   On: 09/27/2018 12:28    Anti-infectives: Anti-infectives (From admission, onward)   Start     Dose/Rate Route Frequency Ordered Stop   09/29/18 1030  amoxicillin-clavulanate (AUGMENTIN) 875-125 MG per tablet 1 tablet     1 tablet Oral Every 12 hours 09/29/18 1022     09/27/18 0200  piperacillin-tazobactam (ZOSYN) IVPB 3.375 g  Status:  Discontinued     3.375 g 12.5 mL/hr over 240 Minutes Intravenous Every 8 hours 09/26/18 2012 09/29/18 1022   09/26/18 1945  piperacillin-tazobactam (ZOSYN) IVPB 3.375 g     3.375 g 100 mL/hr over 30 Minutes Intravenous  Once 09/26/18 1943 09/26/18 2103       Assessment/Plan Tobacco use H/o splenectomy  Perforated diverticulitis with abscess - first bout of diverticulitis - CT 11/15 showed containedperforation Of Bowel in the sigmoid mesentery with abscess and surrounding phlegmon - s/p IR drain 11/16, culture shows gram neg rods, report pending  ID -zosyn 11/15>>11/18, augmentin 11/18>> FEN -IVF, soft diet VTE -SCDs, lovenox Foley -none  Plan- Advance to soft diet. Transition from zosyn to augmentin. Continue drain. CBC in AM.    LOS: 3 days    Franne Forts , [facility] 09/29/2018, 10:22 AM Pager: [phone] Mon 7:00 am -11:30 AM Tues-Fri 7:00 am-4:30 pm Sat-Sun 7:00 am-11:30 am

## 2018-09-29 NOTE — Progress Notes (Signed)
Patients gauze dressing was changed. Site unremarkable, dressing clean, dry and intact.

## 2018-09-29 NOTE — Progress Notes (Signed)
Antibiotic consult:  E.coli sensitivity came from his diverticular abscess culture that intermediate to Unasyn but sens to cefazolin. D/w Dr. Corliss Skainssuei, ok to change to Keflex.  Keflex 500mg  PO TID  Ulyses SouthwardMinh Marton Malizia, PharmD, BCIDP, AAHIVP, CPP Infectious Disease Pharmacist 09/29/2018 3:16 PM

## 2018-09-30 ENCOUNTER — Other Ambulatory Visit: Payer: Self-pay | Admitting: Radiology

## 2018-09-30 DIAGNOSIS — K5720 Diverticulitis of large intestine with perforation and abscess without bleeding: Secondary | ICD-10-CM

## 2018-09-30 LAB — COMPREHENSIVE METABOLIC PANEL
ALBUMIN: 3.1 g/dL — AB (ref 3.5–5.0)
ALT: 30 U/L (ref 0–44)
ANION GAP: 10 (ref 5–15)
AST: 22 U/L (ref 15–41)
Alkaline Phosphatase: 67 U/L (ref 38–126)
BUN: 10 mg/dL (ref 6–20)
CHLORIDE: 105 mmol/L (ref 98–111)
CO2: 23 mmol/L (ref 22–32)
Calcium: 9 mg/dL (ref 8.9–10.3)
Creatinine, Ser: 0.91 mg/dL (ref 0.61–1.24)
GFR calc Af Amer: >60 mL/min (ref >60)
GFR calc non Af Amer: >60 mL/min (ref >60)
GLUCOSE: 89 mg/dL (ref 70–99)
POTASSIUM: 3.6 mmol/L (ref 3.5–5.1)
Sodium: 138 mmol/L (ref 135–145)
Total Bilirubin: 0.4 mg/dL (ref 0.3–1.2)
Total Protein: 6.9 g/dL (ref 6.5–8.1)

## 2018-09-30 LAB — CBC
HEMATOCRIT: 41.2 % (ref 39.0–52.0)
HEMOGLOBIN: 14.3 g/dL (ref 13.0–17.0)
MCH: 30.8 pg (ref 26.0–34.0)
MCHC: 34.7 g/dL (ref 30.0–36.0)
MCV: 88.6 fL (ref 80.0–100.0)
Platelets: 311 10*3/uL (ref 150–400)
RBC: 4.65 MIL/uL (ref 4.22–5.81)
RDW: 11.4 % — ABNORMAL LOW (ref 11.5–15.5)
WBC: 7.3 10*3/uL (ref 4.0–10.5)
nRBC: 0 % (ref 0.0–0.2)

## 2018-09-30 MED ORDER — CEPHALEXIN 500 MG PO CAPS: 500.0000 mg | ORAL_CAPSULE | Freq: Three times a day (TID) | ORAL | 0 refills | Status: AC

## 2018-09-30 MED ORDER — SACCHAROMYCES BOULARDII 250 MG PO CAPS: 250.0000 mg | ORAL_CAPSULE | Freq: Two times a day (BID) | ORAL | Status: AC

## 2018-09-30 MED ORDER — SODIUM CHLORIDE 0.9% FLUSH: 5.0000 mL | Freq: Three times a day (TID) | INTRAVENOUS | 1 refills | Status: AC

## 2018-09-30 MED ORDER — ACETAMINOPHEN 325 MG PO TABS: 650.0000 mg | ORAL_TABLET | Freq: Four times a day (QID) | ORAL | Status: AC | PRN

## 2018-09-30 MED ORDER — ONDANSETRON 4 MG PO TBDP: 4.0000 mg | ORAL_TABLET | Freq: Four times a day (QID) | ORAL | 0 refills | Status: AC | PRN

## 2018-09-30 MED FILL — NORMAL SALINE FLUSH SYRINGE: 0.9 | 10 days supply | Qty: 300 | Fill #0

## 2018-09-30 NOTE — Progress Notes (Signed)
Pt discharge instructions reviewed with pt. Pt verbalizes understanding and states he has no questions. Pt belongings with pt. Pt is not in distress. Pt states he is going to call an Blake Li because his mom can't pick him up right now. Pt refuses wheelchair.

## 2018-09-30 NOTE — Progress Notes (Signed)
Written prescription given to patient

## 2018-09-30 NOTE — Plan of Care (Signed)
  Problem: Activity: Goal: Risk for activity intolerance will decrease Outcome: Completed/Met   Problem: Coping: Goal: Level of anxiety will decrease Outcome: Completed/Met   Problem: Safety: Goal: Ability to remain free from injury will improve Outcome: Completed/Met   Problem: Skin Integrity: Goal: Risk for impaired skin integrity will decrease Outcome: Completed/Met

## 2018-09-30 NOTE — Discharge Summary (Signed)
[facility] Discharge Summary   Patient ID: Blake Li MRN: [id number] DOB/AGE: 36-Jan-1982 36 y.o.  Admit date: 09/26/2018 Discharge date: 09/30/2018  Admitting Diagnosis: Perforated diverticulitis with abscess  Discharge Diagnosis Patient Active Problem List   Diagnosis Date Noted  . Colonic diverticular abscess 09/26/2018  . Intractable vomiting with nausea 12/16/2017  . Upper abdominal pain 12/16/2017    Consultants Interventional radiology  Imaging: No results found.  Procedures Dr. Lowella DandyHenn (09/27/18) - CT guided drainage of colonic diverticular abscess  Hospital Course:  Blake Mediannthony Opiela is a 37yo male who [facility] with 2 weeks of worsening left sided abdominal pain.  Workup included a CT scan which showed perforated diverticulitis with abscess. Patient was admitted and started on IV zosyn IR was consulted and placed a drain in the colonic diverticular abscess.  Once pain improved and leukocytosis normalized diet was advanced as tolerated. On 11/19 the patient was voiding well, tolerating diet, ambulating well, pain well controlled, vital signs stable and felt stable for discharge home with the drain. He will go home on keflex to complete a 2 week course of antibiotics.  Patient will follow up as below and knows to call with questions or concerns.      Physical Exam: Gen: Alert, NAD, pleasant HEENT: EOM's intact, pupils equal and round Pulm: effort normal Abd: Soft,ND, +BS, no HSM, no hernia,nontender  - drain with 10cc purulent output Psych: A&Ox3  Skin: no rashes noted, warm and dry   Allergies as of 09/30/2018   No Known Allergies     Medication List    STOP taking these medications   ibuprofen 600 MG tablet Commonly known as:  ADVIL,MOTRIN   naproxen sodium 220 MG tablet Commonly known as:  ALEVE   potassium chloride 10 MEQ tablet Commonly known as:  K-DUR     TAKE these medications   acetaminophen 325 MG  tablet Commonly known as:  TYLENOL Take 2 tablets (650 mg total) by mouth every 6 (six) hours as needed for mild pain (or temp > 100).   calcium carbonate 500 MG chewable tablet Commonly known as:  TUMS - dosed in mg elemental calcium Chew 2 tablets by mouth daily as needed for indigestion or heartburn.   cephALEXin 500 MG capsule Commonly known as:  KEFLEX Take 1 capsule (500 mg total) by mouth 3 (three) times daily for 11 days.   meloxicam 7.5 MG tablet Commonly known as:  MOBIC Take 1 tablet (7.5 mg total) by mouth daily.   ondansetron 4 MG disintegrating tablet Commonly known as:  ZOFRAN-ODT Take 1 tablet (4 mg total) by mouth every 6 (six) hours as needed for nausea.   saccharomyces boulardii 250 MG capsule Commonly known as:  FLORASTOR Take 1 capsule (250 mg total) by mouth 2 (two) times daily.   sodium chloride flush 0.9 % Soln Commonly known as:  NS 5 mLs by Intracatheter route every 8 (eight) hours.        Follow-up Information    Karie SodaGross, Steven, MD. Go on 10/14/2018.   Specialty:  General Surgery Why:  Your appointment is 10/14/18 at 11:30AM. Please arrive 30 minutes prior to your appointment to check in and fill out paperwork. Bring photo ID and insurance information. Contact information: [address] [place] [address] [phone]           Signed: Franne FortsBrooke A Kelyse Pask, [facility] 09/30/2018, 9:24 AM Pager: [phone] Mon 7:00 am -11:30 AM Tues-Fri 7:00 am-4:30  pm Sat-Sun 7:00 am-11:30 am

## 2018-10-01 ENCOUNTER — Other Ambulatory Visit (HOSPITAL_COMMUNITY): Payer: Self-pay | Admitting: Interventional Radiology

## 2018-10-01 ENCOUNTER — Other Ambulatory Visit: Payer: Self-pay | Admitting: Surgery

## 2018-10-01 DIAGNOSIS — K5720 Diverticulitis of large intestine with perforation and abscess without bleeding: Secondary | ICD-10-CM

## 2018-10-02 ENCOUNTER — Other Ambulatory Visit: Payer: Self-pay

## 2018-10-02 ENCOUNTER — Ambulatory Visit: Payer: BLUE CROSS/BLUE SHIELD | Admitting: Emergency Medicine

## 2018-10-02 ENCOUNTER — Encounter: Payer: Self-pay | Admitting: Emergency Medicine

## 2018-10-02 VITALS — BP 110/73 | HR 82 | Temp 99.0°F | Resp 16 | Ht 64.25 in | Wt 187.4 lb

## 2018-10-02 DIAGNOSIS — K5720 Diverticulitis of large intestine with perforation and abscess without bleeding: Secondary | ICD-10-CM

## 2018-10-02 LAB — AEROBIC/ANAEROBIC CULTURE (SURGICAL/DEEP WOUND)

## 2018-10-02 LAB — AEROBIC/ANAEROBIC CULTURE W GRAM STAIN (SURGICAL/DEEP WOUND): Special Requests: NORMAL

## 2018-10-02 NOTE — Patient Instructions (Addendum)
     If you have lab work done today you will be contacted with your lab results within the next 2 weeks.  If you have not heard from us then please contact us. The fastest way to get your results is to register for My Chart.   IF you received an x-ray today, you will receive an invoice from [facility]. Please contact  Radiology at [phone] with questions or concerns regarding your invoice.   IF you received labwork today, you will receive an invoice from [facility]. Please contact [facility] at [phone] with questions or concerns regarding your invoice.   Our billing staff will not be able to assist you with questions regarding bills from these companies.  You will be contacted with the lab results as soon as they are available. The fastest way to get your results is to activate your My Chart account. Instructions are located on the last page of this paperwork. If you have not heard from us regarding the results in 2 weeks, please contact this office.      Diverticulitis Diverticulitis is when small pockets in your large intestine (colon) get infected or swollen. This causes stomach pain and watery poop (diarrhea). These pouches are called diverticula. They form in people who have a condition called diverticulosis. Follow these instructions at home: Medicines  Take over-the-counter and prescription medicines only as told by your doctor. These include: ? Antibiotics. ? Pain medicines. ? Fiber pills. ? Probiotics. ? Stool softeners.  Do not drive or use heavy machinery while taking prescription pain medicine.  If you were prescribed an antibiotic, take it as told. Do not stop taking it even if you feel better. General instructions  Follow a diet as told by your doctor.  When you feel better, your doctor may tell you to change your diet. You may need to eat a lot of fiber. Fiber makes it easier to poop (have bowel movements). Healthy foods with fiber  include: ? Berries. ? Beans. ? Lentils. ? Green vegetables.  Exercise 3 or more times a week. Aim for 30 minutes each time. Exercise enough to sweat and make your heart beat faster.  Keep all follow-up visits as told. This is important. You may need to have an exam of the large intestine. This is called a colonoscopy. Contact a doctor if:  Your pain does not get better.  You have a hard time eating or drinking.  You are not pooping like normal. Get help right away if:  Your pain gets worse.  Your problems do not get better.  Your problems get worse very fast.  You have a fever.  You throw up (vomit) more than one time.  You have poop that is: ? Bloody. ? Black. ? Tarry. Summary  Diverticulitis is when small pockets in your large intestine (colon) get infected or swollen.  Take medicines only as told by your doctor.  Follow a diet as told by your doctor. This information is not intended to replace advice given to you by your health care provider. Make sure you discuss any questions you have with your health care provider. Document Released: 04/16/2008 Document Revised: 11/15/2016 Document Reviewed: 11/15/2016 Elsevier Interactive Patient Education  2017 [employer]  

## 2018-10-02 NOTE — Progress Notes (Signed)
Blake Li 37 y.o.   Chief Complaint  Patient presents with  . colonic diverticular abcess    follow up ED at [facility] 09/30/18    HISTORY OF PRESENT ILLNESS: This is a 37 y.o. male here for follow-up of a diverticular abscess, released from hospital on 09/30/2018.  Doing well.  On Keflex 500 mg 3 times a day.  Not taking pain medication or Zofran.  Eating and drinking well.  Denies nausea or vomiting.  Denies fever or chills.  Having bowel movements without any problems.  Drain draining well.  Has no complaints or medical concerns at this time.  HPI   Prior to Admission medications   Medication Sig Start Date End Date Taking? Authorizing Provider  acetaminophen (TYLENOL) 325 MG tablet Take 2 tablets (650 mg total) by mouth every 6 (six) hours as needed for mild pain (or temp > 100). 09/30/18  Yes Meuth, Brooke A, PA-C  calcium carbonate (TUMS - DOSED IN MG ELEMENTAL CALCIUM) 500 MG chewable tablet Chew 2 tablets by mouth daily as needed for indigestion or heartburn.   Yes [provider]  cephALEXin (KEFLEX) 500 MG capsule Take 1 capsule (500 mg total) by mouth 3 (three) times daily for 11 days. 09/30/18 10/11/18 Yes Meuth, Brooke A, PA-C  ondansetron (ZOFRAN-ODT) 4 MG disintegrating tablet Take 1 tablet (4 mg total) by mouth every 6 (six) hours as needed for nausea. 09/30/18  Yes Meuth, Brooke A, PA-C  saccharomyces boulardii (FLORASTOR) 250 MG capsule Take 1 capsule (250 mg total) by mouth 2 (two) times daily. 09/30/18  Yes Meuth, Brooke A, PA-C  sodium chloride flush (NS) 0.9 % SOLN 5 mLs by Intracatheter route every 8 (eight) hours. 09/30/18  Yes Meuth, Brooke A, PA-C  meloxicam (MOBIC) 7.5 MG tablet Take 1 tablet (7.5 mg total) by mouth daily. Patient not taking: Reported on 10/02/2018 09/14/18   Georgetta Haber, NP    No Known Allergies  Patient Active Problem List   Diagnosis Date Noted  . Colonic diverticular abscess 09/26/2018  . Intractable vomiting with nausea  12/16/2017  . Upper abdominal pain 12/16/2017    No past medical history on file.  Past Surgical History:  Procedure Laterality Date  . FRACTURE SURGERY Left 2002  . ORIF FEMUR FRACTURE Left 2005   titanium rod    Social History   Socioeconomic History  . Marital status: Single    Spouse name: Not on file  . Number of children: Not on file  . Years of education: Not on file  . Highest education level: Not on file  Occupational History  . Not on file  Social Needs  . Financial resource strain: Not on file  . Food insecurity:    Worry: Not on file    Inability: Not on file  . Transportation needs:    Medical: Not on file    Non-medical: Not on file  Tobacco Use  . Smoking status: Current Every Day Smoker    Packs/day: 1.00    Types: Cigarettes  . Smokeless tobacco: Never Used  Substance and Sexual Activity  . Alcohol use: Yes    Alcohol/week: 12.0 standard drinks    Types: 6 Cans of beer, 6 Shots of liquor per week  . Drug use: Yes    Frequency: 7.0 times per week    Types: Marijuana  . Sexual activity: Not on file  Lifestyle  . Physical activity:    Days per week: Not on file    Minutes  per session: Not on file  . Stress: Not on file  Relationships  . Social connections:    Talks on phone: Not on file    Gets together: Not on file    Attends religious service: Not on file    Active member of club or organization: Not on file    Attends meetings of clubs or organizations: Not on file    Relationship status: Not on file  . Intimate partner violence:    Fear of current or ex partner: Not on file    Emotionally abused: Not on file    Physically abused: Not on file    Forced sexual activity: Not on file  Other Topics Concern  . Not on file  Social History Narrative  . Not on file    Family History  Problem Relation Age of Onset  . Heart failure Father   . Diabetes Father   . Heart disease Father   . Hyperlipidemia Father      Review of Systems    Constitutional: Negative.  Negative for chills and fever.  HENT: Negative.  Negative for sore throat.   Eyes: Negative.   Respiratory: Negative.  Negative for cough and shortness of breath.   Cardiovascular: Negative.  Negative for chest pain and palpitations.  Gastrointestinal: Negative.  Negative for abdominal pain, diarrhea, nausea and vomiting.  Genitourinary: Negative.   Skin: Negative.  Negative for rash.  Neurological: Negative.  Negative for dizziness and headaches.    Vitals:   10/02/18 1344  BP: 110/73  Pulse: 82  Resp: 16  Temp: 99 F (37.2 C)  SpO2: 96%    Physical Exam  Constitutional: He is oriented to person, place, and time. He appears well-developed and well-nourished.  HENT:  Head: Normocephalic and atraumatic.  Eyes: Pupils are equal, round, and reactive to light. EOM are normal.  Neck: Normal range of motion. Neck supple.  Cardiovascular: Normal rate and regular rhythm.  Pulmonary/Chest: Effort normal and breath sounds normal.  Abdominal: Soft. He exhibits no distension. There is no tenderness.  Drain in good place, left lower abdomen, draining well.  Musculoskeletal: Normal range of motion.  Neurological: He is alert and oriented to person, place, and time.  Skin: Skin is warm and dry.  Psychiatric: He has a normal mood and affect. His behavior is normal.  Vitals reviewed.    ASSESSMENT & PLAN: Blake Li was seen today for colonic diverticular abcess.  Diagnoses and all orders for this visit:  Colonic diverticular abscess  Continue present treatment and antibiotics.  Follow-up with drain clinic as scheduled.  Advised about red flag signs and symptoms.  Needs to follow-up with surgeon/PCP as scheduled or needed.  Patient Instructions       If you have lab work done today you will be contacted with your lab results within the next 2 weeks.  If you have not heard from [place] then please contact us. The fastest way to get your results is to register  for My Chart.   IF you received an x-ray today, you will receive an invoice from [facility]. Please contact [facility] at [phone] with questions or concerns regarding your invoice.   IF you received labwork today, you will receive an invoice from [place]. Please contact [facility] at [phone] with questions or concerns regarding your invoice.   Our billing staff will not be able to assist you with questions regarding bills from these companies.  You will be contacted with the lab results as  soon as they are available. The fastest way to get your results is to activate your My Chart account. Instructions are located on the last page of this paperwork. If you have not heard from us regarding the results in 2 weeks, please contact this office.     Diverticulitis Diverticulitis is when small pockets in your large intestine (colon) get infected or swollen. This causes stomach pain and watery poop (diarrhea). These pouches are called diverticula. They form in people who have a condition called diverticulosis. Follow these instructions at home: Medicines  Take over-the-counter and prescription medicines only as told by your doctor. These include: ? Antibiotics. ? Pain medicines. ? Fiber pills. ? Probiotics. ? Stool softeners.  Do not drive or use heavy machinery while taking prescription pain medicine.  If you were prescribed an antibiotic, take it as told. Do not stop taking it even if you feel better. General instructions  Follow a diet as told by your doctor.  When you feel better, your doctor may tell you to change your diet. You may need to eat a lot of fiber. Fiber makes it easier to poop (have bowel movements). Healthy foods with fiber include: ? Berries. ? Beans. ? Lentils. ? Green vegetables.  Exercise 3 or more times a week. Aim for 30 minutes each time. Exercise enough to sweat and make your heart beat faster.  Keep all follow-up visits as  told. This is important. You may need to have an exam of the large intestine. This is called a colonoscopy. Contact a doctor if:  Your pain does not get better.  You have a hard time eating or drinking.  You are not pooping like normal. Get help right away if:  Your pain gets worse.  Your problems do not get better.  Your problems get worse very fast.  You have a fever.  You throw up (vomit) more than one time.  You have poop that is: ? Bloody. ? Black. ? Tarry. Summary  Diverticulitis is when small pockets in your large intestine (colon) get infected or swollen.  Take medicines only as told by your doctor.  Follow a diet as told by your doctor. This information is not intended to replace advice given to you by your health care provider. Make sure you discuss any questions you have with your health care provider. Document Released: 04/16/2008 Document Revised: 11/15/2016 Document Reviewed: 11/15/2016 Elsevier Interactive Patient Education  2017 Elsevier Inc.      Edwina BarthMiguel Tatjana Turcott, MD [facility]

## 2018-10-08 MED FILL — NORMAL SALINE FLUSH SYRINGE: 0.9 | 10 days supply | Qty: 300 | Fill #1

## 2018-10-15 ENCOUNTER — Ambulatory Visit
Admission: RE | Admit: 2018-10-15 | Discharge: 2018-10-15 | Disposition: A | Discharge status: Home / Self Care | Payer: BLUE CROSS/BLUE SHIELD | Source: Ambulatory Visit | Attending: Surgery | Admitting: Surgery

## 2018-10-15 ENCOUNTER — Other Ambulatory Visit: Payer: Self-pay | Admitting: Surgery

## 2018-10-15 ENCOUNTER — Encounter: Payer: Self-pay | Admitting: Radiology

## 2018-10-15 DIAGNOSIS — K5720 Diverticulitis of large intestine with perforation and abscess without bleeding: Secondary | ICD-10-CM

## 2018-10-15 HISTORY — PX: IR RADIOLOGIST EVAL & MGMT: IMG5224

## 2018-10-15 MED ORDER — IOHEXOL 300 MG/ML  SOLN
100.0000 mL | Freq: Once | INTRAMUSCULAR | Status: AC | PRN
  Administered 2018-10-15: 100 mL via INTRAVENOUS

## 2018-10-15 NOTE — Progress Notes (Signed)
Chief Complaint: Diverticular abscess  Referring Physician(s): Tsuei,Matthew  Supervising Physician: Oley Balm  History of Present Illness: Blake Li is a 37 y.o. male who presented to [facility]/15 with 2 weeks of worsening left sided abdominal pain.    Workup included a CT scan which showed perforated diverticulitis with abscess.  He was admitted and started on IV zosyn.  Our service was consulted and he underwent placement of a drain in the colonic diverticular abscess by Dr. Lowella Dandy on 09/27/2018.    On 11/19 he was discharged home with the drain.   He has completed a 2 week course of antibiotics.    He is here today for follow up CT scan and for drain injection to evaluate for fistula connection.  He is flushing the drain twice a day with only 2 mL. He states there is minimal output and that the flush is coming out around the drain.   No past medical history on file.  Past Surgical History:  Procedure Laterality Date  . FRACTURE SURGERY Left 2002  . ORIF FEMUR FRACTURE Left 2005   titanium rod    Allergies: Patient has no known allergies.  Medications: Prior to Admission medications   Medication Sig Start Date End Date Taking? Authorizing Provider  acetaminophen (TYLENOL) 325 MG tablet Take 2 tablets (650 mg total) by mouth every 6 (six) hours as needed for mild pain (or temp > 100). 09/30/18   Meuth, Brooke A, PA-C  calcium carbonate (TUMS - DOSED IN MG ELEMENTAL CALCIUM) 500 MG chewable tablet Chew 2 tablets by mouth daily as needed for indigestion or heartburn.    [provider]  meloxicam (MOBIC) 7.5 MG tablet Take 1 tablet (7.5 mg total) by mouth daily. Patient not taking: Reported on 10/02/2018 09/14/18   Linus Mako B, NP  ondansetron (ZOFRAN-ODT) 4 MG disintegrating tablet Take 1 tablet (4 mg total) by mouth every 6 (six) hours as needed for nausea. 09/30/18   Meuth, Lina Sar, PA-C  saccharomyces boulardii (FLORASTOR) 250 MG  capsule Take 1 capsule (250 mg total) by mouth 2 (two) times daily. 09/30/18   Meuth, Brooke A, PA-C  sodium chloride flush (NS) 0.9 % SOLN 5 mLs by Intracatheter route every 8 (eight) hours. 09/30/18   Meuth, Lina Sar, PA-C     Family History  Problem Relation Age of Onset  . Heart failure Father   . Diabetes Father   . Heart disease Father   . Hyperlipidemia Father     Social History   Socioeconomic History  . Marital status: Single    Spouse name: Not on file  . Number of children: Not on file  . Years of education: Not on file  . Highest education level: Not on file  Occupational History  . Not on file  Social Needs  . Financial resource strain: Not on file  . Food insecurity:    Worry: Not on file    Inability: Not on file  . Transportation needs:    Medical: Not on file    Non-medical: Not on file  Tobacco Use  . Smoking status: Current Every Day Smoker    Packs/day: 1.00    Types: Cigarettes  . Smokeless tobacco: Never Used  Substance and Sexual Activity  . Alcohol use: Yes    Alcohol/week: 12.0 standard drinks    Types: 6 Cans of beer, 6 Shots of liquor per week  . Drug use: Yes    Frequency: 7.0 times per week  Types: Marijuana  . Sexual activity: Not on file  Lifestyle  . Physical activity:    Days per week: Not on file    Minutes per session: Not on file  . Stress: Not on file  Relationships  . Social connections:    Talks on phone: Not on file    Gets together: Not on file    Attends religious service: Not on file    Active member of club or organization: Not on file    Attends meetings of clubs or organizations: Not on file    Relationship status: Not on file  Other Topics Concern  . Not on file  Social History Narrative  . Not on file   Review of Systems  Physical Exam  Constitutional: He is oriented to person, place, and time. He appears well-developed.  HENT:  Head: Normocephalic and atraumatic.  Neck: Normal range of motion.    Pulmonary/Chest: Effort normal. No respiratory distress.  Abdominal: Soft. He exhibits no distension. There is no tenderness.  Musculoskeletal: Normal range of motion.  Neurological: He is alert and oriented to person, place, and time.  Psychiatric: He has a normal mood and affect. His behavior is normal. Judgment and thought content normal.     Imaging: Ct Abdomen Pelvis W Contrast  Addendum Date: 09/26/2018   ADDENDUM REPORT: 09/26/2018 19:35 ADDENDUM: Study discussed by telephone with Dr. Kennis Carina on 09/26/2018 at 1929 hours. Electronically Signed   By: Odessa Fleming M.D.   On: 09/26/2018 19:35   Result Date: 09/26/2018 CLINICAL DATA:  37 year old male with abdominal pain and dysuria beginning 5 days ago. Diverticulitis suspected. EXAM: CT ABDOMEN AND PELVIS WITH CONTRAST TECHNIQUE: Multidetector CT imaging of the abdomen and pelvis was performed using the standard protocol following bolus administration of intravenous contrast. CONTRAST:  OMNIPAQUE IOHEXOL 300 MG/ML  SOLN COMPARISON:  CT Abdomen and Pelvis 12/17/2017 and earlier. FINDINGS: Lower chest: Negative.  No pericardial or pleural effusion. Hepatobiliary: Hepatic steatosis is less apparent. Negative liver and gallbladder. Pancreas: Negative. Spleen: Negative. Adrenals/Urinary Tract: Normal adrenal glands. Bilateral renal enhancement is symmetric and normal. No hydroureter. Secondary inflammation along the course of the left ureter particularly where it crosses the pelvic inlet. Inflammation over the bladder dome, but the bladder remains within normal limits. Stomach/Bowel: There is a contained perforation with abscess in the mesentery of the sigmoid colon encompassing 37 x 35 x 73 millimeters (AP by transverse by CC) for an estimated abscess volume of 47 milliliters. Surrounding inflammation and phlegmon located near the bladder dome (sagittal image 69). Inflammation involves the course of the left ureter. Regional wall thickening of  the sigmoid colon, throughout its course. There are relatively few diverticula. The rectum and descending colon appear normal. Negative transverse colon aside from redundancy. Negative right colon and appendix. Negative terminal ileum. Mild secondary involvement of lower abdominal small bowel by the sigmoid mesenteric abnormality. No dilated small bowel. Decompressed stomach and duodenum. No free intraperitoneal air or fluid. Vascular/Lymphatic: Major arterial structures are patent and appear normal. Chronic IVC filter appears unchanged. Portal venous system is patent. No lymphadenopathy. Reproductive: Negative. Other: No pelvic free fluid. Musculoskeletal: No acute osseous abnormality identified. Partially visible previous proximal left femur ORIF. IMPRESSION: 1. Contained Perforation Of Bowel in the sigmoid mesentery with abscess and surrounding phlegmon. Superimposed sigmoid colitis. Estimated abscess volume of 47 mL. 2. No associated bowel obstruction. No free intraperitoneal air or fluid. 3. Secondary inflammation of distal abdominal small bowel, the course of the left  ureter, and to a lesser extent bladder dome. Electronically Signed: By: Odessa FlemingH  Hall M.D. On: 09/26/2018 19:25   Ct Image Guided Drainage By Percutaneous Catheter  Result Date: 09/27/2018 INDICATION: 37 year old with a pericolonic diverticular abscess. Plan for percutaneous drain placement. EXAM: CT GUIDED DRAINAGE OF PERICOLONIC ABSCESS MEDICATIONS: The patient is currently admitted to the hospital and receiving intravenous antibiotics. ANESTHESIA/SEDATION: 2.0 mg IV Versed 100 mcg IV Fentanyl Moderate Sedation Time:  12 minutes The patient was continuously monitored during the procedure by the interventional radiology nurse under my direct supervision. COMPLICATIONS: None immediate. TECHNIQUE: Informed written consent was obtained from the patient after a thorough discussion of the procedural risks, benefits and alternatives. All questions were  addressed. A timeout was performed prior to the initiation of the procedure. PROCEDURE: Patient was placed supine on the CT scanner with the left side mildly elevated. Images through the lower abdomen were obtained. The pericolonic abscess adjacent to the sigmoid colon was targeted. The left lateral abdomen was prepped and draped in sterile fashion. Maximal barrier sterile technique was utilized including caps, mask, sterile gowns, sterile gloves, sterile drape, hand hygiene and skin antiseptic. Skin and soft tissues anesthetized with 1% lidocaine. 18 gauge trocar needle was directed into the pericolonic abscess with CT guidance. Yellow purulent fluid was aspirated. A stiff Amplatz wire was placed. The tract was dilated to accommodate a 10.2 [place] multipurpose drain. 30 mL of bloody purulent, foul-smelling fluid was removed from the abscess. Follow up CT images were obtained. Catheter was sutured to skin and attached to a suction bulb. FINDINGS: Pericolonic abscess just posterior to the sigmoid colon. 30 mL of purulent fluid was removed. The abscess was decompressed at the end of the procedure. IMPRESSION: CT-guided placement of a drainage catheter within the pericolonic abscess. Electronically Signed   By: Richarda OverlieAdam  Henn M.D.   On: 09/27/2018 12:28    Labs:  CBC: Recent Labs    09/27/18 0832 09/28/18 0609 09/29/18 0536 09/30/18 0419  WBC 11.4* 7.2 7.5 7.3  HGB 12.9* 13.0 13.8 14.3  HCT 38.8* 38.3* 40.4 41.2  PLT 229 229 266 311    COAGS: Recent Labs    09/27/18 0248  INR 0.97    BMP: Recent Labs    09/27/18 0248 09/28/18 0609 09/29/18 0536 09/30/18 0419  NA 135 138 137 138  K 3.8 3.8 3.9 3.6  CL 104 102 102 105  CO2 24 28 28 23   GLUCOSE 102* 103* 92 89  BUN 9 8 7 10   CALCIUM 8.3* 8.7* 8.9 9.0  CREATININE 0.81 1.12 1.00 0.91  GFRNONAA >60 >60 >60 >60  GFRAA >60 >60 >60 >60    LIVER FUNCTION TESTS: Recent Labs    09/27/18 0248 09/28/18 0609 09/29/18 0536 09/30/18 0419    BILITOT 0.7 0.8 0.8 0.4  AST 19 17 19 22   ALT 26 24 26 30   ALKPHOS 81 74 78 67  PROT 6.0* 6.0* 6.7 6.9  ALBUMIN 2.9* 2.8* 3.1* 3.1*    TUMOR MARKERS: No results for input(s): AFPTM, CEA, CA199, CHROMGRNA in the last 8760 hours.  Assessment/Plan:  Perforated diverticulitis with abscess.  S/P placement of a drain in the colonic diverticular abscess by Dr. Lowella DandyHenn on 09/27/2018.    CT today reviewed by Dr. Deanne CofferHassell. Abscess has resolved however there is a fistulous connection to the colon on drain injection.  Will change him to a gravity bag and he is to continue to flush gently with only 2 mL daily.  He asked  if he could return to work. 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 I explained he could return to work.  He will return here in 2 weeks with drain injection only. No CT needed.  He has an appointment with Dr. Michaell Cowing tomorrow.  Electronically Signed: Gwynneth Macleod PA-C 10/15/2018, 1:00 PM    Please refer to Dr. Dereck Ligas attestation of this note for management and plan.

## 2018-10-29 ENCOUNTER — Ambulatory Visit
Admission: RE | Admit: 2018-10-29 | Discharge: 2018-10-29 | Disposition: A | Discharge status: Home / Self Care | Payer: BLUE CROSS/BLUE SHIELD | Source: Ambulatory Visit | Attending: Surgery | Admitting: Surgery

## 2018-10-29 ENCOUNTER — Other Ambulatory Visit (HOSPITAL_COMMUNITY): Payer: Self-pay | Admitting: Interventional Radiology

## 2018-10-29 DIAGNOSIS — K5720 Diverticulitis of large intestine with perforation and abscess without bleeding: Secondary | ICD-10-CM

## 2018-10-29 HISTORY — PX: IR RADIOLOGIST EVAL & MGMT: IMG5224

## 2018-10-29 NOTE — Progress Notes (Signed)
Chief Complaint: Abscess from diverticulitis   Referring Physician(s): Tsuei,Matthew  History of Present Illness: Blake Li is a 37 y.o. male presenting for a scheduled appointment with image guided drainage of diverticular abscess.    His CT diagnosis was 09/26/2018, during admission, and he had drainage performed on 09/27/2018.    Since then, he has been discharged, and he tells me that he has not had any significant output form the drain, essentially 0.  He denies fevers/rigors/chills.  He has completed ABX.   His last drain injection was 10/15/2018, documenting no abscess, but a fistula to the sigmoid.   His drain injection today confirms small persisting fistula.      No past medical history on file.  Past Surgical History:  Procedure Laterality Date  . FRACTURE SURGERY Left 2002  . IR RADIOLOGIST EVAL & MGMT  10/15/2018  . IR RADIOLOGIST EVAL & MGMT  10/29/2018  . ORIF FEMUR FRACTURE Left 2005   titanium rod    Allergies: Patient has no known allergies.  Medications: Prior to Admission medications   Medication Sig Start Date End Date Taking? Authorizing Provider  acetaminophen (TYLENOL) 325 MG tablet Take 2 tablets (650 mg total) by mouth every 6 (six) hours as needed for mild pain (or temp > 100). 09/30/18   Meuth, Brooke A, PA-C  calcium carbonate (TUMS - DOSED IN MG ELEMENTAL CALCIUM) 500 MG chewable tablet Chew 2 tablets by mouth daily as needed for indigestion or heartburn.    [provider]  meloxicam (MOBIC) 7.5 MG tablet Take 1 tablet (7.5 mg total) by mouth daily. Patient not taking: Reported on 10/02/2018 09/14/18   Linus Mako B, NP  ondansetron (ZOFRAN-ODT) 4 MG disintegrating tablet Take 1 tablet (4 mg total) by mouth every 6 (six) hours as needed for nausea. 09/30/18   Meuth, Lina Sar, PA-C  saccharomyces boulardii (FLORASTOR) 250 MG capsule Take 1 capsule (250 mg total) by mouth 2 (two) times daily. 09/30/18   Meuth, Brooke A,  PA-C  sodium chloride flush (NS) 0.9 % SOLN 5 mLs by Intracatheter route every 8 (eight) hours. 09/30/18   Meuth, Lina Sar, PA-C     Family History  Problem Relation Age of Onset  . Heart failure Father   . Diabetes Father   . Heart disease Father   . Hyperlipidemia Father     Social History   Socioeconomic History  . Marital status: Single    Spouse name: Not on file  . Number of children: Not on file  . Years of education: Not on file  . Highest education level: Not on file  Occupational History  . Not on file  Social Needs  . Financial resource strain: Not on file  . Food insecurity:    Worry: Not on file    Inability: Not on file  . Transportation needs:    Medical: Not on file    Non-medical: Not on file  Tobacco Use  . Smoking status: Current Every Day Smoker    Packs/day: 1.00    Types: Cigarettes  . Smokeless tobacco: Never Used  Substance and Sexual Activity  . Alcohol use: Yes    Alcohol/week: 12.0 standard drinks    Types: 6 Cans of beer, 6 Shots of liquor per week  . Drug use: Yes    Frequency: 7.0 times per week    Types: Marijuana  . Sexual activity: Not on file  Lifestyle  . Physical activity:    Days per week:  Not on file    Minutes per session: Not on file  . Stress: Not on file  Relationships  . Social connections:    Talks on phone: Not on file    Gets together: Not on file    Attends religious service: Not on file    Active member of club or organization: Not on file    Attends meetings of clubs or organizations: Not on file    Relationship status: Not on file  Other Topics Concern  . Not on file  Social History Narrative  . Not on file     Review of Systems: A 12 point ROS discussed and pertinent positives are indicated in the HPI above.  All other systems are negative.  Review of Systems  Vital Signs: BP (!) 132/92   Pulse 75   Temp 98.7 F (37.1 C)   SpO2 99%   Physical Exam Targeted exam at the skin site, shows no  evidence of infection.  Clean dry.  No ballotable fluid.    Mallampati Score:     Imaging: Ct Abdomen Pelvis W Contrast  Result Date: 10/15/2018 CLINICAL DATA:  Previous sigmoid colitis and loculated adjacent intraperitoneal abscess, post percutaneous drain catheter placement 09/27/2018. EXAM: CT ABDOMEN AND PELVIS WITH CONTRAST TECHNIQUE: Multidetector CT imaging of the abdomen and pelvis was performed using the standard protocol following bolus administration of intravenous contrast. CONTRAST:  OMNIPAQUE IOHEXOL 300 MG/ML  SOLN COMPARISON:  09/26/2018 FINDINGS: Lower chest: No acute abnormality. Hepatobiliary: No focal liver abnormality is seen. No gallstones, gallbladder wall thickening, or biliary dilatation. Pancreas: Unremarkable. No pancreatic ductal dilatation or surrounding inflammatory changes. Spleen: Normal in size without focal abnormality. Adrenals/Urinary Tract: Adrenal glands are unremarkable. Kidneys are normal, without renal calculi, focal lesion, or hydronephrosis. Bladder is unremarkable. Stomach/Bowel: Stomach is decompressed. Small bowel nondilated. Normal appendix. The colon is decompressed. A few scattered sigmoid diverticula. The wall thickening and adjacent inflammatory/edematous changes seen previously have nearly completely resolved. Stable left pelvic drain catheter posterior and superior to the mid sigmoid colon. Interval evacuation of the extraluminal fluid collection with no residual or undrained component. No new regional fluid collections. Vascular/Lymphatic: No arterial pathology evident. No abdominal or pelvic adenopathy. Infrarenal IVC filter with caval wall penetration by at least 2 legs, and slight tilt; the tip does not appear imbedded in the wall. There is incomplete venous opacification due to scan timing but no suspicion for iliocaval venous thrombosis. Reproductive: Mild prostatic enlargement with central coarse calcifications. Other: No ascites.  No free  air. Musculoskeletal: Left femoral IM rod partially visualized. No fracture or worrisome bone lesion. IMPRESSION: 1. Interval resolution of left pelvic peritoneal abscess post drain catheter placement. No new, residual or undrained fluid collection. 2. Sigmoid diverticulosis 3. Retrievable infrarenal IVC filter Electronically Signed   By: Corlis Leak M.D.   On: 10/15/2018 13:55   Dg Sinus/fist Tube Chk-non Gi  Result Date: 10/15/2018 CLINICAL DATA:  Left pelvic abscess adjacent to sigmoid colon, post percutaneous drain catheter placement 09/27/2018. No residual extraluminal component noted on follow-up CT today. EXAM: ABSCESS DRAIN CATHETER INJECTION UNDER FLUOROSCOPY TECHNIQUE: The procedure, risks (including but not limited to bleeding, infection, organ damage), benefits, and alternatives were explained to the patient. Questions regarding the procedure were encouraged and answered. The patient understands and consents to the procedure. Survey fluoroscopic inspection reveals partial retraction of the drain catheter in the left pelvis since initial placement study of 09/27/2018. Injection demonstrates near complete resolution of the extraluminal abscess  cavity. There is fistula to the sigmoid colon which is decompressed. IMPRESSION: 1. 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 Electronically Signed   By: Corlis Leak  Hassell M.D.   On: 10/15/2018 13:58   Ir Radiologist Eval & Mgmt  Result Date: 10/29/2018 Please refer to notes tab for details about interventional procedure. (Op Note)  Ir Radiologist Eval & Mgmt  Result Date: 10/15/2018 Please refer to notes tab for details about interventional procedure. (Op Note)   Labs:  CBC: Recent Labs    09/27/18 0832 09/28/18 0609 09/29/18 0536 09/30/18 0419  WBC 11.4* 7.2 7.5 7.3  HGB 12.9* 13.0 13.8 14.3  HCT 38.8* 38.3* 40.4 41.2  PLT 229 229 266 311    COAGS: Recent Labs    09/27/18 0248  INR 0.97    BMP: Recent  Labs    09/27/18 0248 09/28/18 0609 09/29/18 0536 09/30/18 0419  NA 135 138 137 138  K 3.8 3.8 3.9 3.6  CL 104 102 102 105  CO2 24 28 28 23   GLUCOSE 102* 103* 92 89  BUN 9 8 7 10   CALCIUM 8.3* 8.7* 8.9 9.0  CREATININE 0.81 1.12 1.00 0.91  GFRNONAA >60 >60 >60 >60  GFRAA >60 >60 >60 >60    LIVER FUNCTION TESTS: Recent Labs    09/27/18 0248 09/28/18 0609 09/29/18 0536 09/30/18 0419  BILITOT 0.7 0.8 0.8 0.4  AST 19 17 19 22   ALT 26 24 26 30   ALKPHOS 81 74 78 67  PROT 6.0* 6.0* 6.7 6.9  ALBUMIN 2.9* 2.8* 3.1* 3.1*    TUMOR MARKERS: No results for input(s): AFPTM, CEA, CA199, CHROMGRNA in the last 8760 hours.  Assessment and Plan:  Mr Michail JewelsCrabtree has a persisting small fistulous connection to the sigmoid colon from a 2F pigtail catheter, placed secondary to his diverticular abscess.    I have discussed a drain exchange, which could possible disconnect any small adherence/connection to the sigmoid, by slightly repositioning.    He would like to proceed with this, and would like to perform before January 1st, when his insurance changes.    I have also instructed him to stop flushing.   Plan: - plan for routine drain exchange, for attempt to slightly reposition 2F drain and disconnect any adherence to the colon, trying to heal the fistula. He would like to perform this before Dec 31, when his insurance changes.  - stop flushing.    Electronically Signed: Gilmer MorJaime Kiandre Spagnolo 10/29/2018, 9:27 AM   I spent a total of    10 Minutes in face to face in clinical consultation, greater than 50% of which was counseling/coordinating care for ct guided abscess drain, fistula

## 2018-10-30 ENCOUNTER — Ambulatory Visit (HOSPITAL_COMMUNITY)
Admission: RE | Admit: 2018-10-30 | Discharge: 2018-10-30 | Disposition: A | Discharge status: Home / Self Care | Payer: BLUE CROSS/BLUE SHIELD | Source: Ambulatory Visit | Attending: Interventional Radiology | Admitting: Interventional Radiology

## 2018-10-30 ENCOUNTER — Other Ambulatory Visit (HOSPITAL_COMMUNITY): Payer: Self-pay | Admitting: Diagnostic Radiology

## 2018-10-30 ENCOUNTER — Encounter (HOSPITAL_COMMUNITY): Payer: Self-pay | Admitting: Diagnostic Radiology

## 2018-10-30 DIAGNOSIS — Z4803 Encounter for change or removal of drains: Secondary | ICD-10-CM | POA: Diagnosis present

## 2018-10-30 DIAGNOSIS — K5720 Diverticulitis of large intestine with perforation and abscess without bleeding: Secondary | ICD-10-CM | POA: Diagnosis not present

## 2018-10-30 DIAGNOSIS — L0291 Cutaneous abscess, unspecified: Secondary | ICD-10-CM

## 2018-10-30 HISTORY — PX: IR CATHETER TUBE CHANGE: IMG717

## 2018-10-30 MED ORDER — IOPAMIDOL (ISOVUE-300) INJECTION 61%
INTRAVENOUS | Status: AC
  Administered 2018-10-30: 20 mL
  Filled 2018-10-30: qty 50

## 2018-10-30 MED ORDER — LIDOCAINE HCL 1 % IJ SOLN
INTRAMUSCULAR | Status: AC
  Filled 2018-10-30: qty 20

## 2018-10-30 MED ORDER — LIDOCAINE HCL 1 % IJ SOLN
INTRAMUSCULAR | Status: DC | PRN
  Administered 2018-10-30: 5 mL

## 2018-10-30 NOTE — Procedures (Signed)
Interventional Radiology Procedure:   Indications: History of diverticular abscess that resolved with drain but persistent colonic fistula.  Procedure: Drain injection and exchange  Findings: Colonic fistula and placed new 10 pigtail.  Attempted to place Foley in tract but not adequate.   Complications: None     EBL: Minimal  Plan: Gravity drain and no flushing.  [place].    Blake Li R. Lowella DandyHenn, MD  Pager: [phone]

## 2018-11-07 ENCOUNTER — Other Ambulatory Visit: Payer: Self-pay | Admitting: Physician Assistant

## 2018-11-10 ENCOUNTER — Ambulatory Visit (HOSPITAL_COMMUNITY)
Admission: RE | Admit: 2018-11-10 | Discharge: 2018-11-10 | Disposition: A | Discharge status: Home / Self Care | Payer: BLUE CROSS/BLUE SHIELD | Source: Ambulatory Visit | Attending: Diagnostic Radiology | Admitting: Diagnostic Radiology

## 2018-11-10 ENCOUNTER — Encounter (HOSPITAL_COMMUNITY): Payer: Self-pay | Admitting: Interventional Radiology

## 2018-11-10 DIAGNOSIS — Z4803 Encounter for change or removal of drains: Secondary | ICD-10-CM | POA: Insufficient documentation

## 2018-11-10 DIAGNOSIS — L0291 Cutaneous abscess, unspecified: Secondary | ICD-10-CM

## 2018-11-10 HISTORY — PX: IR SINUS/FIST TUBE CHK-NON GI: IMG673

## 2018-11-10 MED ORDER — IOPAMIDOL (ISOVUE-300) INJECTION 61%
INTRAVENOUS | Status: AC
  Administered 2018-11-10: 1 mL
  Filled 2018-11-10: qty 50

## 2018-11-13 ENCOUNTER — Encounter (HOSPITAL_COMMUNITY): Payer: Self-pay | Admitting: Emergency Medicine

## 2018-11-13 ENCOUNTER — Emergency Department (HOSPITAL_COMMUNITY)
Admission: EM | Admit: 2018-11-13 | Discharge: 2018-11-13 | Discharge status: Against Medical Advice | Payer: BLUE CROSS/BLUE SHIELD | Attending: Emergency Medicine | Admitting: Emergency Medicine

## 2018-11-13 ENCOUNTER — Other Ambulatory Visit: Payer: Self-pay

## 2018-11-13 DIAGNOSIS — Z5321 Procedure and treatment not carried out due to patient leaving prior to being seen by health care provider: Secondary | ICD-10-CM | POA: Insufficient documentation

## 2018-11-13 DIAGNOSIS — R1032 Left lower quadrant pain: Secondary | ICD-10-CM | POA: Diagnosis present

## 2018-11-13 LAB — CBC WITH DIFFERENTIAL/PLATELET
Abs Immature Granulocytes: 0.04 10*3/uL (ref 0.00–0.07)
Basophils Absolute: 0 10*3/uL (ref 0.0–0.1)
Basophils Relative: 0 %
Eosinophils Absolute: 0.1 10*3/uL (ref 0.0–0.5)
Eosinophils Relative: 1 %
HCT: 43.7 % (ref 39.0–52.0)
HEMOGLOBIN: 14.9 g/dL (ref 13.0–17.0)
Immature Granulocytes: 0 %
Lymphocytes Relative: 15 %
Lymphs Abs: 2.1 10*3/uL (ref 0.7–4.0)
MCH: 31.6 pg (ref 26.0–34.0)
MCHC: 34.1 g/dL (ref 30.0–36.0)
MCV: 92.8 fL (ref 80.0–100.0)
MONO ABS: 1.1 10*3/uL — AB (ref 0.1–1.0)
Monocytes Relative: 8 %
Neutro Abs: 10 10*3/uL — ABNORMAL HIGH (ref 1.7–7.7)
Neutrophils Relative %: 76 %
Platelets: 193 10*3/uL (ref 150–400)
RBC: 4.71 MIL/uL (ref 4.22–5.81)
RDW: 12.7 % (ref 11.5–15.5)
WBC: 13.3 10*3/uL — ABNORMAL HIGH (ref 4.0–10.5)
nRBC: 0 % (ref 0.0–0.2)

## 2018-11-13 LAB — COMPREHENSIVE METABOLIC PANEL
ALT: 71 U/L — AB (ref 0–44)
AST: 35 U/L (ref 15–41)
Albumin: 4.2 g/dL (ref 3.5–5.0)
Alkaline Phosphatase: 71 U/L (ref 38–126)
Anion gap: 14 (ref 5–15)
BUN: 16 mg/dL (ref 6–20)
CO2: 20 mmol/L — ABNORMAL LOW (ref 22–32)
Calcium: 9.3 mg/dL (ref 8.9–10.3)
Chloride: 104 mmol/L (ref 98–111)
Creatinine, Ser: 0.86 mg/dL (ref 0.61–1.24)
GFR calc non Af Amer: >60 mL/min (ref >60)
Glucose, Bld: 126 mg/dL — ABNORMAL HIGH (ref 70–99)
Potassium: 3.5 mmol/L (ref 3.5–5.1)
Sodium: 138 mmol/L (ref 135–145)
Total Bilirubin: 0.5 mg/dL (ref 0.3–1.2)
Total Protein: 7 g/dL (ref 6.5–8.1)

## 2018-11-13 LAB — I-STAT CG4 LACTIC ACID, ED
Lactic Acid, Venous: 1.55 mmol/L (ref 0.5–1.9)
Lactic Acid, Venous: 1.23 mmol/L (ref 0.5–1.9)

## 2018-11-13 LAB — PROTIME-INR
INR: 0.82
PROTHROMBIN TIME: 11.3 s — AB (ref 11.4–15.2)

## 2018-11-13 NOTE — ED Triage Notes (Signed)
Patient has LLQ drain removed for diverticulitis 3 days ago and noticed green yellow drainage this morning with abdominal pain. Denies nausea, emesis, or diarrhea.

## 2018-11-13 NOTE — ED Notes (Signed)
Pt refused vitals rechecked and walked out

## 2018-11-18 LAB — CULTURE, BLOOD (ROUTINE X 2)
Culture: NO GROWTH
Culture: NO GROWTH
SPECIAL REQUESTS: ADEQUATE

## 2018-12-30 ENCOUNTER — Other Ambulatory Visit: Payer: Self-pay | Admitting: *Deleted

## 2018-12-30 ENCOUNTER — Other Ambulatory Visit: Payer: Self-pay | Admitting: Interventional Radiology

## 2018-12-30 DIAGNOSIS — Z95828 Presence of other vascular implants and grafts: Secondary | ICD-10-CM

## 2019-05-26 IMAGING — RF DG SINUS / FISTULA TRACT / ABSCESSOGRAM
3 series · 6 of 6 positions shown · non-contrast
Comparison: none

CLINICAL DATA: Left pelvic abscess adjacent to sigmoid colon, post
percutaneous drain catheter placement 09/27/2018. No residual
extraluminal component noted on follow-up CT today.

EXAM:
ABSCESS DRAIN CATHETER INJECTION UNDER FLUOROSCOPY
TECHNIQUE: The procedure, risks (including but not limited to bleeding,
infection, organ damage), benefits, and alternatives were explained
to the patient. Questions regarding the procedure were encouraged
and answered. The patient understands and consents to the procedure.
Survey fluoroscopic inspection reveals partial retraction of the
drain catheter in the left pelvis since initial placement study of
09/27/2018..
Injection demonstrates near complete resolution of the extraluminal
abscess cavity. There is fistula to the sigmoid colon which is
decompressed..

[Series 1: one shot · 1 of 1 slices shown (1 of 2)]
[im 1/1]
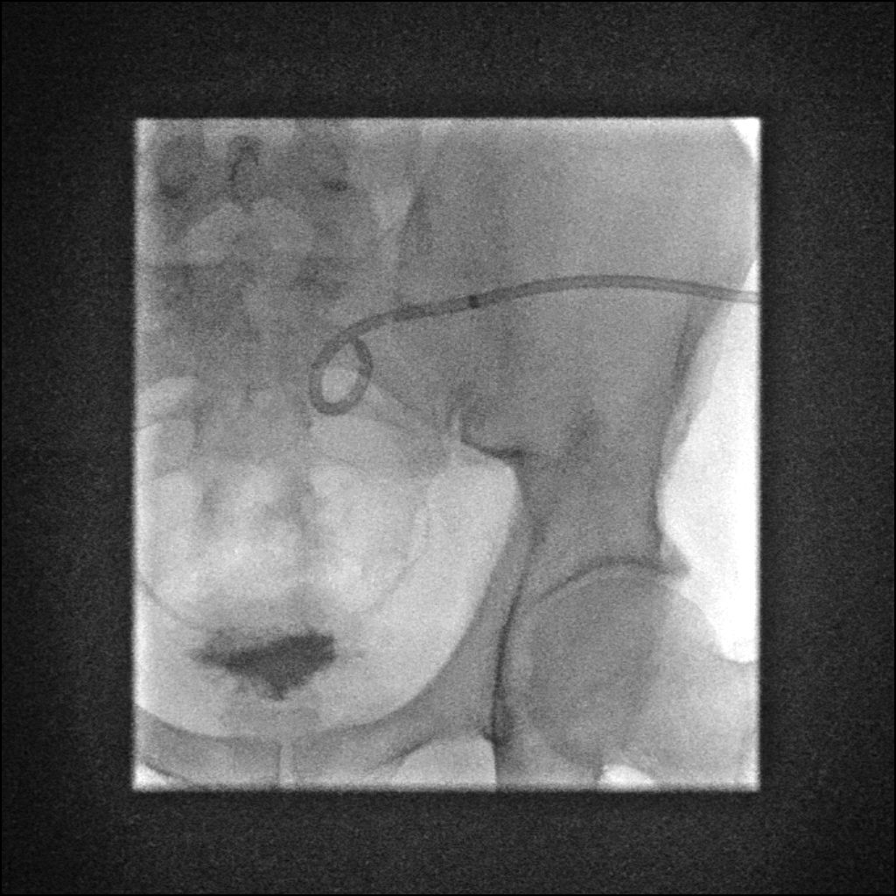

[Series 2: sequence · 4 of 35 frames shown]
[frame 1/35]
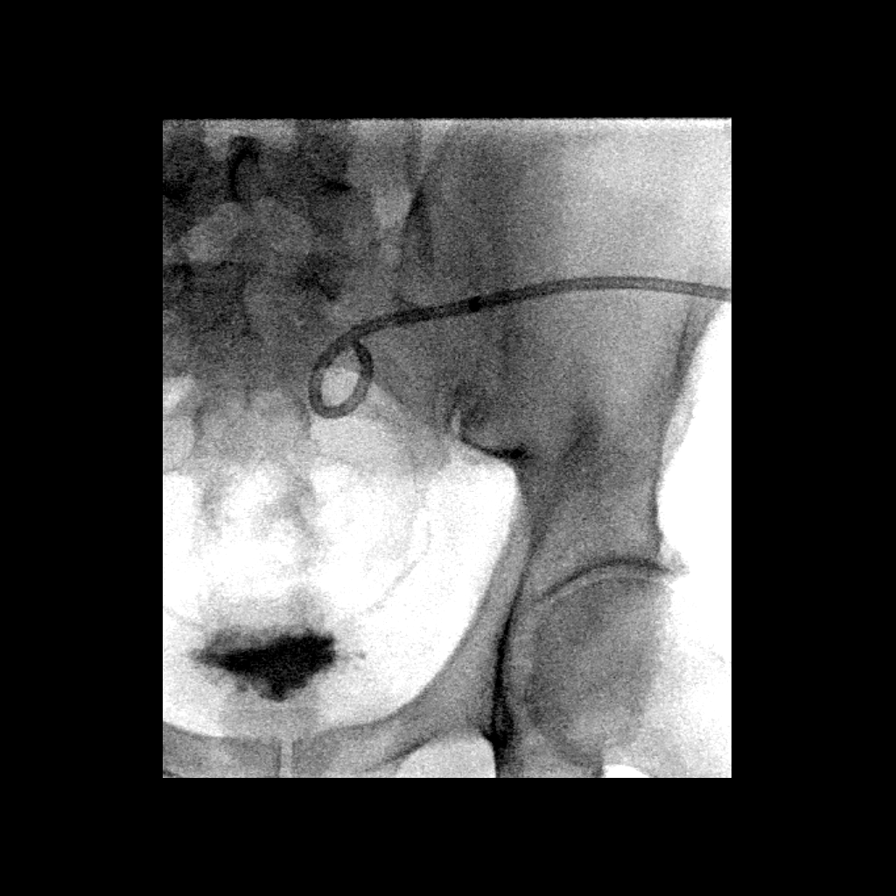
[frame 6/35]
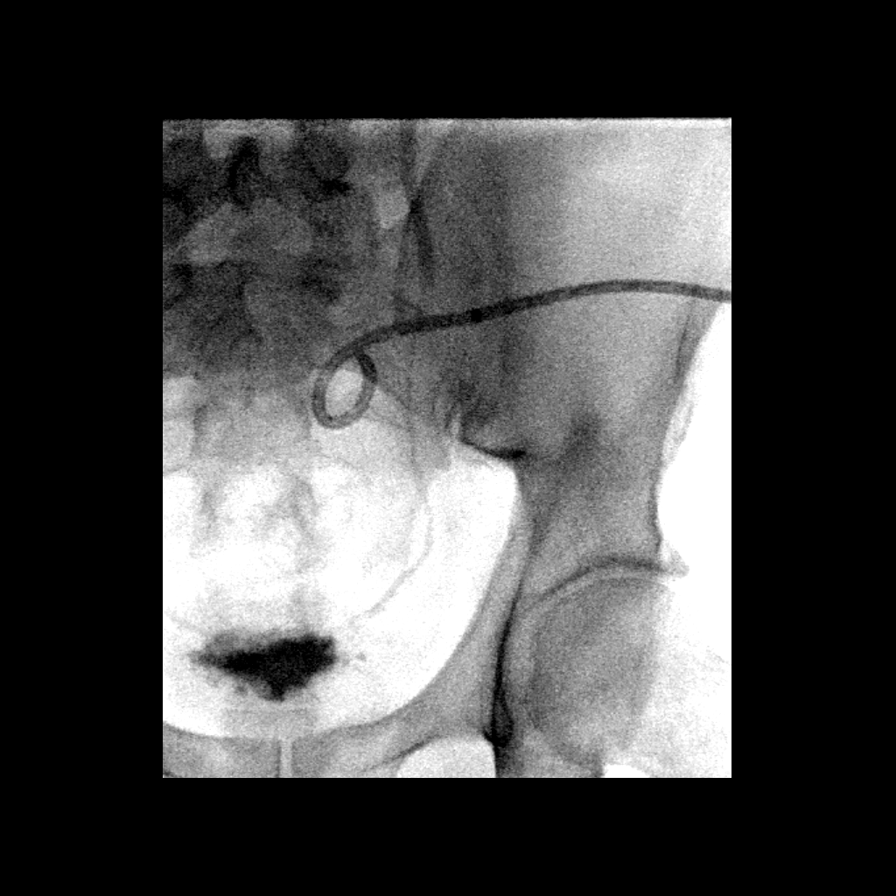
[frame 18/35]
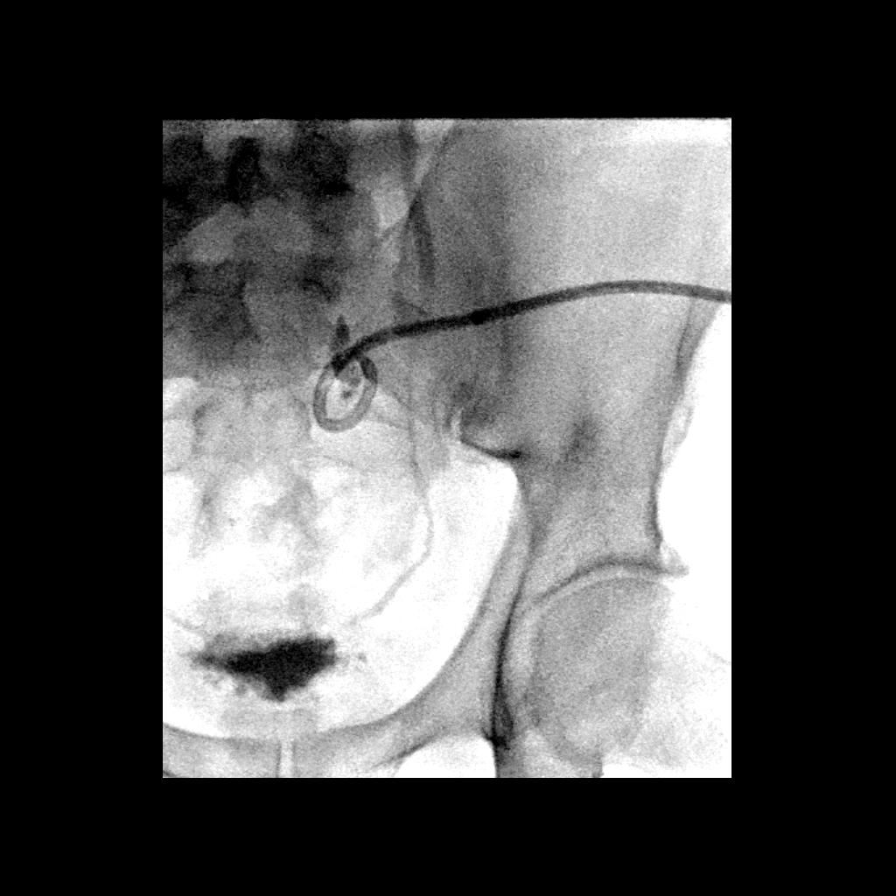
[frame 30/35]
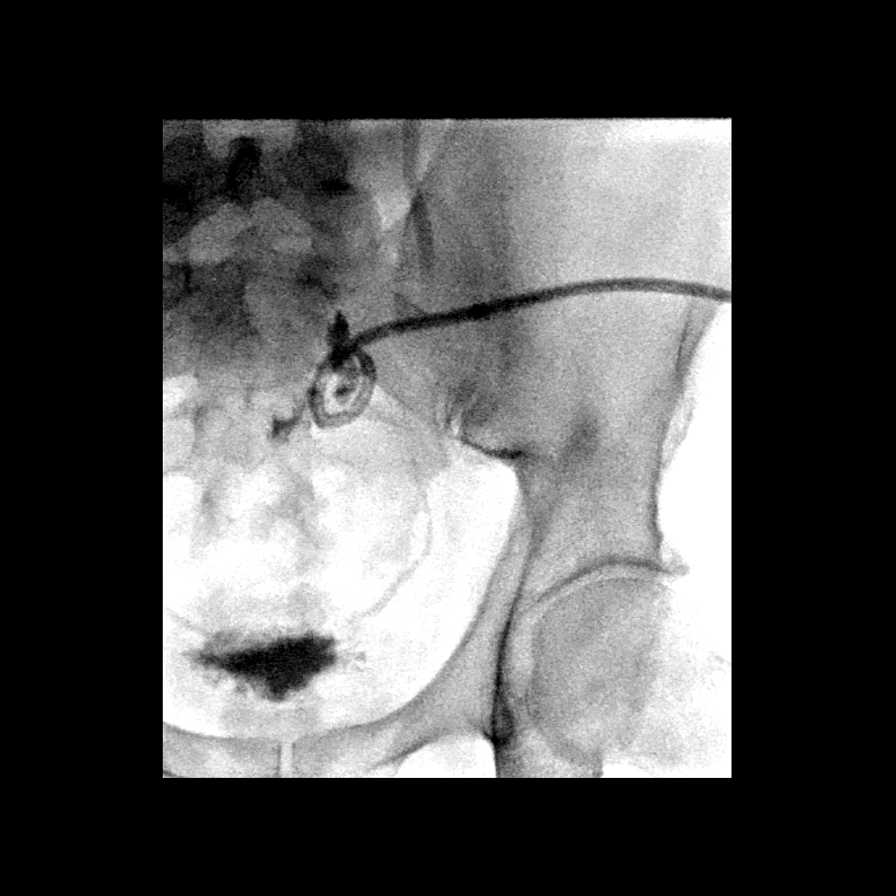

[Series 3: one shot · 0.15mm/px · 1 of 1 slices shown (2 of 2)]
[im 1/1]
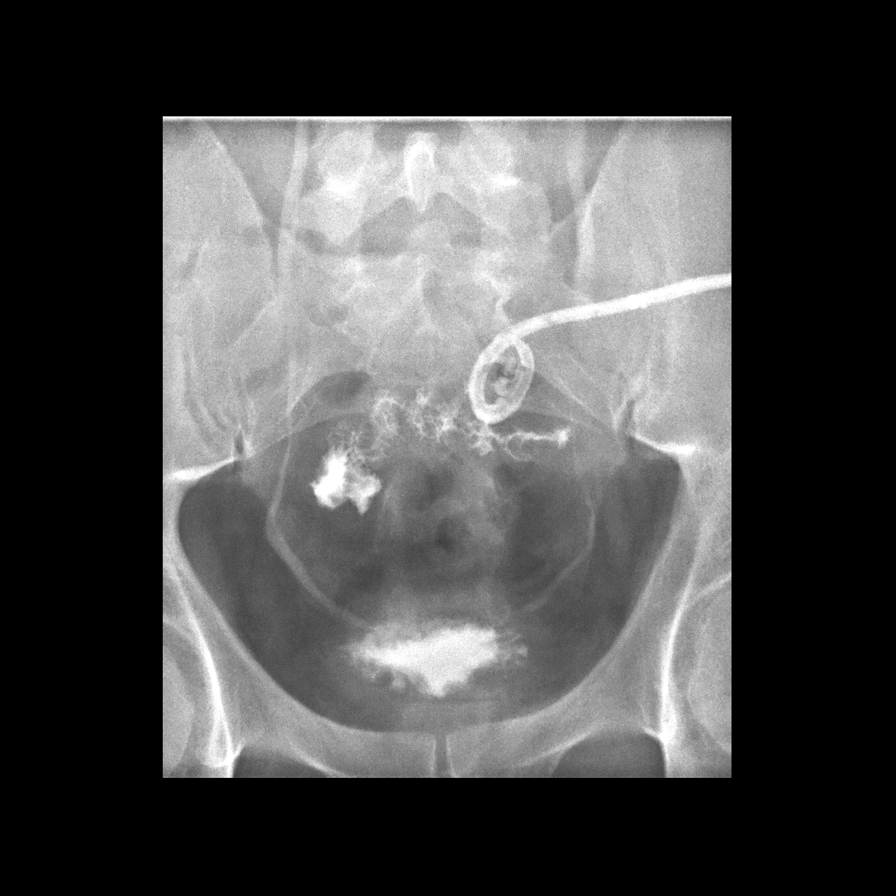

[6 of 6 positions shown; findings below may reference images not displayed]

IMPRESSION: 1. Resolution of abscess cavity with persistent fistula to the
sigmoid colon.

## 2019-06-10 IMAGING — XA IR CATHETER TUBE CHANGE
7 series · 13 of 24 positions shown · non-contrast
Comparison: none

Addendum:
INDICATION: 37-year-old with history of colonic diverticular abscess that has
been treated with percutaneous drainage. Follow-up drain injection
demonstrates a persistent colonic fistula. Patient presents for
drain exchange.

[Series 1: single · 1 of 1 slices shown]
[im 1/1]
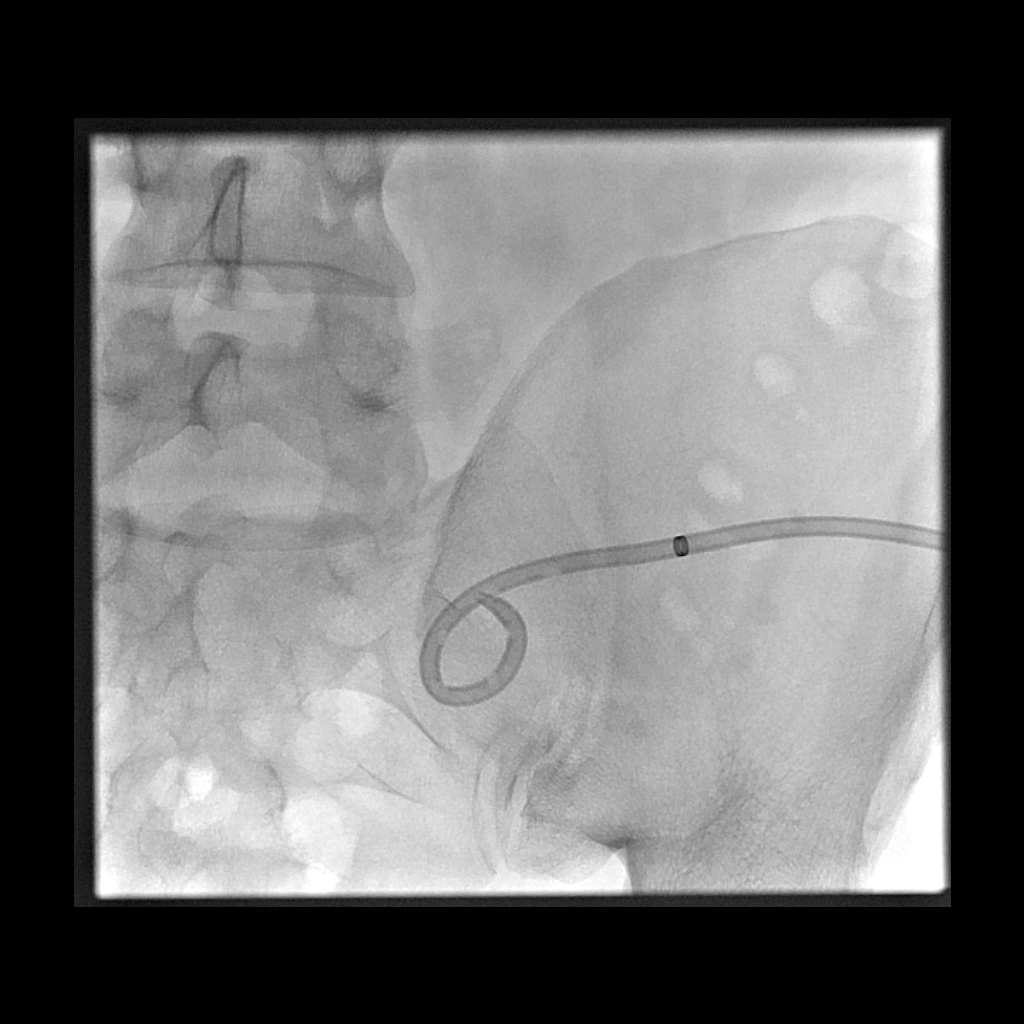

[Series 2: fl (-) angio · 2 of 70 frames shown (1 of 6)]
[frame 11/70]
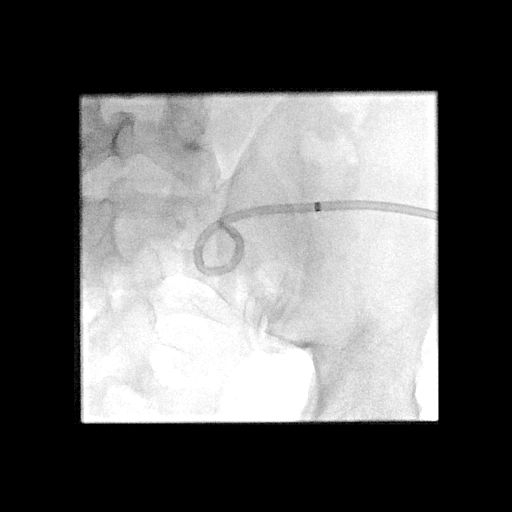
[frame 60/70]
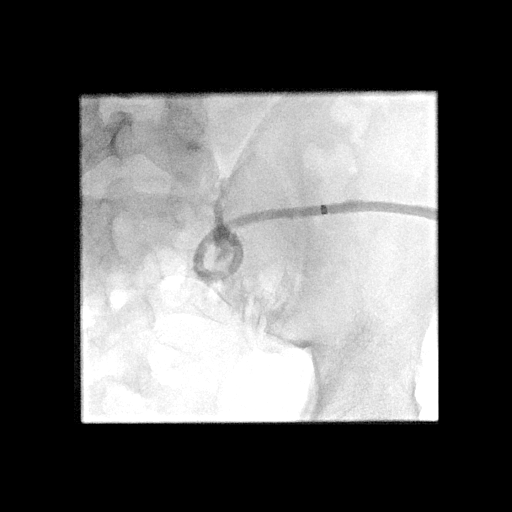

[Series 3: fl (-) angio · 1 of 56 frames shown (2 of 6)]
[frame 29/56]
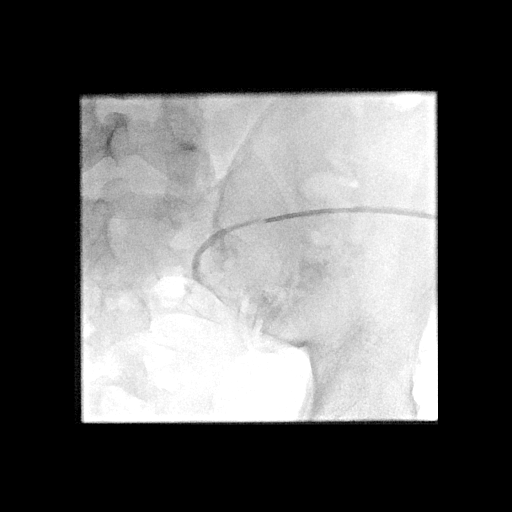

[Series 4: fl (-) angio · 2 of 30 frames shown (3 of 6)]
[frame 5/30]
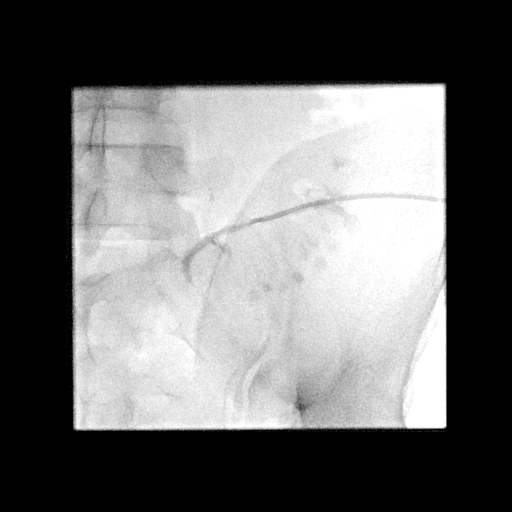
[frame 26/30]
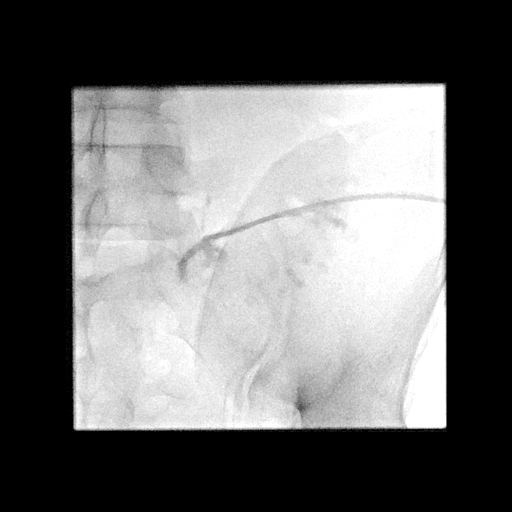

[Series 5: fl (-) angio · 3 of 66 frames shown (4 of 6)]
[frame 10/66]
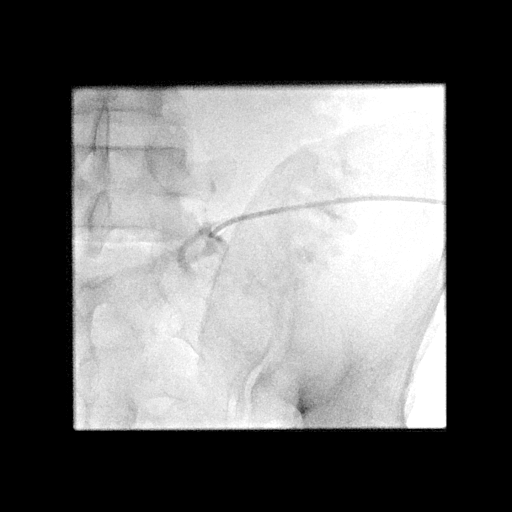
[frame 34/66]
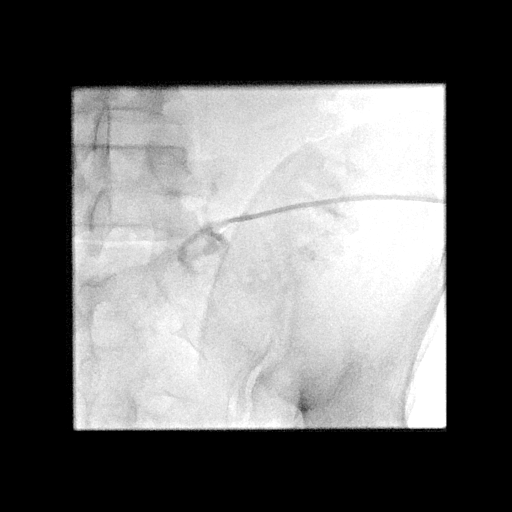
[frame 65/66]
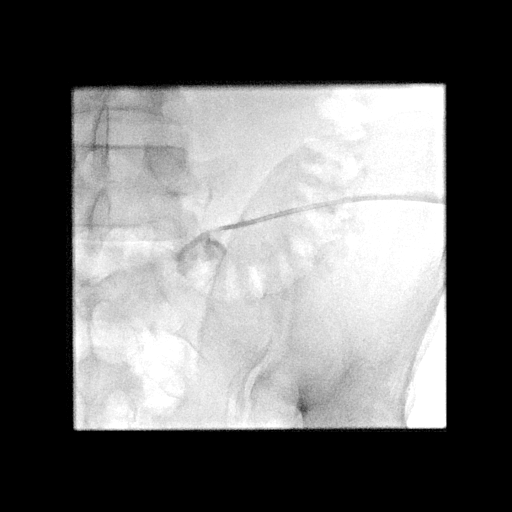

[Series 6: fl (-) angio · 1 of 35 frames shown (5 of 6)]
[frame 6/35]
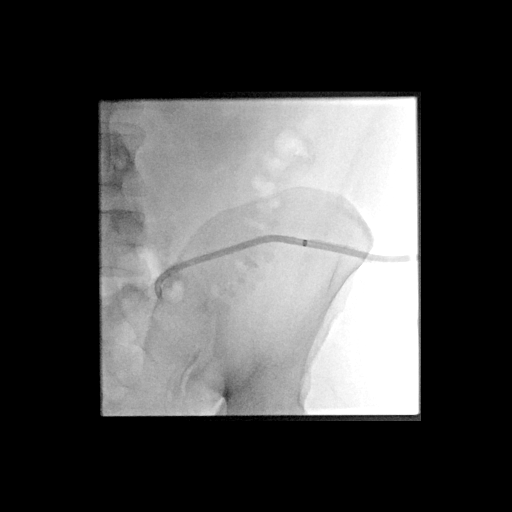

[Series 7: fl (-) angio · 2 acquisitions, 3 frames shown (6 of 6)]
[im 1/2]
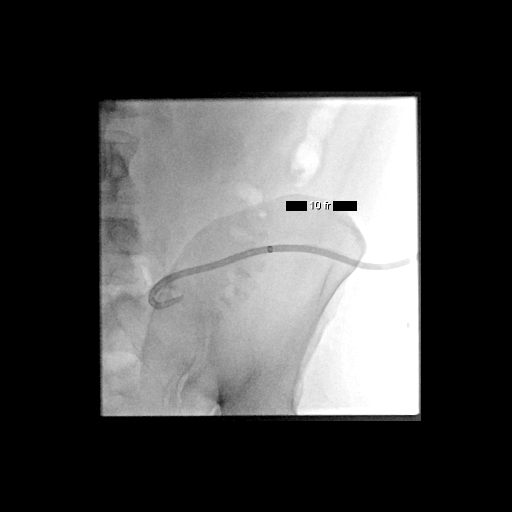
[im 1/2]
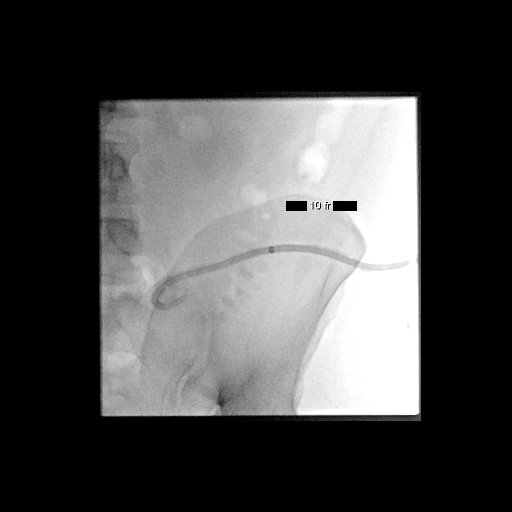
[im 2/2]
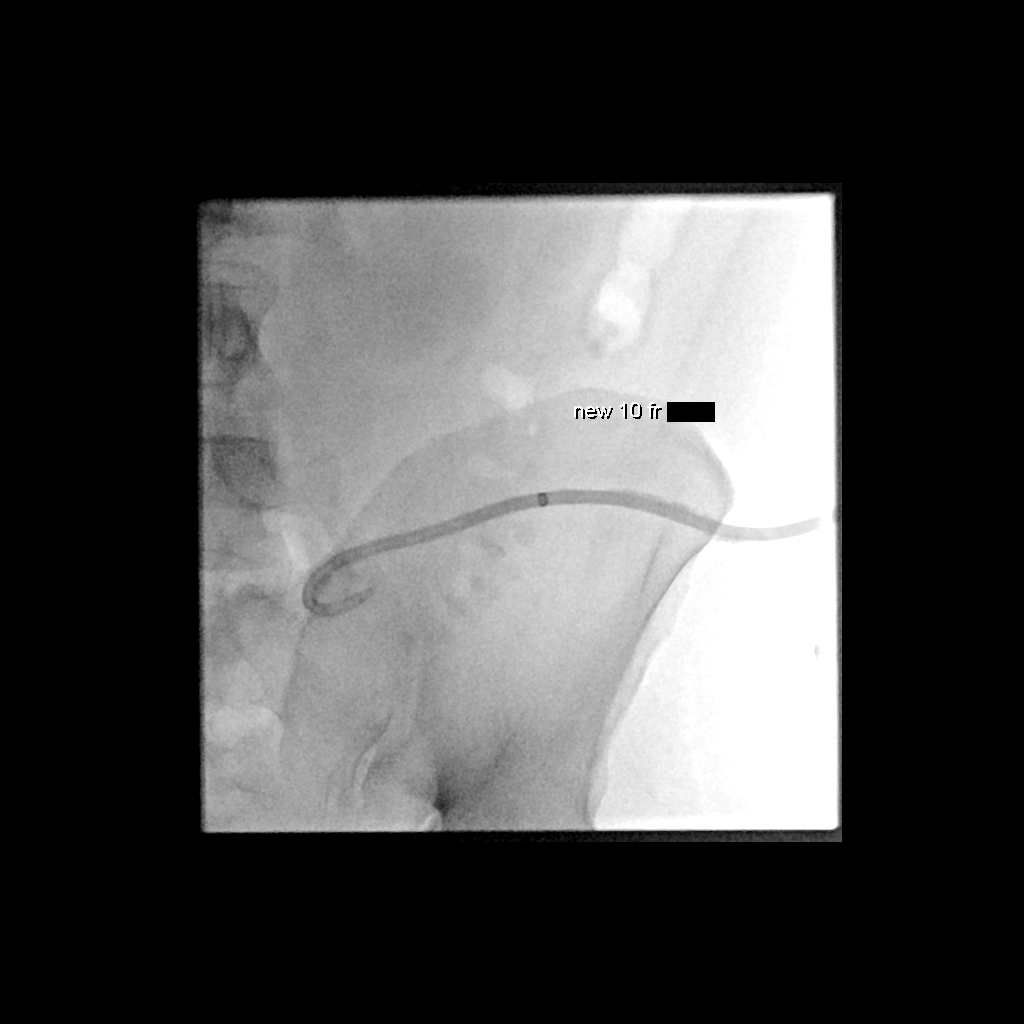

[13 of 24 positions shown; findings below may reference images not displayed]

EXAM:
DRAIN INJECTION AND EXCHANGE WITH FLUOROSCOPY

MEDICATIONS:
None

ANESTHESIA/SEDATION:
None

COMPLICATIONS:
None immediate.

PROCEDURE:
Informed written consent was obtained from the patient after a
thorough discussion of the procedural risks, benefits and
alternatives. All questions were addressed. Maximal Sterile Barrier
Technique was utilized including caps, mask, sterile gowns, sterile
gloves, sterile drape, hand hygiene and skin antiseptic. A timeout
was performed prior to the initiation of the procedure.

The left abdominal drain was prepped and draped in sterile fashion.
Skin was anesthetized with 1% lidocaine. The catheter was injected
under fluoroscopy. The catheter was cut and removed over a stiff
Glidewire. Kumpe catheter was placed and additional drain injections
were performed. Attempted to place a 10 French Foley catheter in the
drain tract but the tube would not advance easily. Therefore, a new
10.2 French multipurpose drain was advanced over the wire.
Initially, the pigtail was not formed but there was significant
leakage on the skin site with an injection. Therefore, the catheter
was slightly advanced and pigtail was partially formed. Follow-up
drain injection was performed. Catheter was sutured to the skin and
flushed with 2 mL of saline. Catheter was attached to gravity bag.
FINDINGS: No significant cavity around the drain. Persistent fistula tract to
the colon.
IMPRESSION: 1. Persistent colonic fistula.
2. Successful exchange of the 10 French drain.
3. Patient has been scheduled for a follow-up drain injection.

FLUOROSCOPY TIME:  3 minutes and 48 seconds, 25 mGy

*** End of Addendum ***

## 2024-07-25 ENCOUNTER — Ambulatory Visit
Admission: EM | Admit: 2024-07-25 | Discharge: 2024-07-25 | Disposition: A | Discharge status: Home / Self Care | Payer: Self-pay | Attending: Family Medicine | Admitting: Family Medicine

## 2024-07-25 DIAGNOSIS — L255 Unspecified contact dermatitis due to plants, except food: Secondary | ICD-10-CM

## 2024-07-25 MED ORDER — HYDROXYZINE HCL 25 MG PO TABS: 12.5000 mg | ORAL_TABLET | Freq: Three times a day (TID) | ORAL | 0 refills | Status: AC | PRN

## 2024-07-25 MED ORDER — PREDNISONE 20 MG PO TABS: ORAL_TABLET | ORAL | 0 refills | Status: AC

## 2024-07-25 NOTE — ED Provider Notes (Signed)
 [facility]  Note:  This document was prepared using Conservation officer, historic buildings and may include unintentional dictation errors.  MRN: [id number] DOB: 31-Mar-1981  Subjective:   Blake Li is a 43 y.o. male presenting for 4 day history of acute onset itching rash, spreading from his arms to the upper parts of his face.  Has itching across the eyes.  Patient unfortunately came into contact with poison ivy really allergic.  Has previously responded well with steroids.  No oral swelling, difficulty breathing.  No current facility-administered medications for this encounter.  Current Outpatient Medications:    acetaminophen  (TYLENOL ) 325 MG tablet, Take 2 tablets (650 mg total) by mouth every 6 (six) hours as needed for mild pain (or temp > 100)., Disp: , Rfl:    calcium carbonate (TUMS - DOSED IN MG ELEMENTAL CALCIUM) 500 MG chewable tablet, Chew 2 tablets by mouth daily as needed for indigestion or heartburn., Disp: , Rfl:    meloxicam  (MOBIC ) 7.5 MG tablet, Take 1 tablet (7.5 mg total) by mouth daily. (Patient not taking: Reported on 10/02/2018), Disp: 20 tablet, Rfl: 0   ondansetron  (ZOFRAN -ODT) 4 MG disintegrating tablet, Take 1 tablet (4 mg total) by mouth every 6 (six) hours as needed for nausea., Disp: 15 tablet, Rfl: 0   saccharomyces boulardii (FLORASTOR) 250 MG capsule, Take 1 capsule (250 mg total) by mouth 2 (two) times daily., Disp: , Rfl:    sodium chloride  flush (NS) 0.9 % SOLN, 5 mLs by Intracatheter route every 8 (eight) hours., Disp: 30 Syringe, Rfl: 1   No Known Allergies  History reviewed. No pertinent past medical history.   Past Surgical History:  Procedure Laterality Date   FRACTURE SURGERY Left 2002   IR CATHETER TUBE CHANGE  10/30/2018   IR RADIOLOGIST EVAL & MGMT  10/15/2018   IR RADIOLOGIST EVAL & MGMT  10/29/2018   IR SINUS/FIST TUBE CHK-NON GI  11/10/2018   ORIF FEMUR FRACTURE Left 2005   titanium rod    Family History   Problem Relation Age of Onset   Heart failure Father    Diabetes Father    Heart disease Father    Hyperlipidemia Father     Social History   Tobacco Use   Smoking status: Every Day    Current packs/day: 1.00    Types: Cigarettes   Smokeless tobacco: Never  Vaping Use   Vaping status: Some Days  Substance Use Topics   Alcohol use: Not Currently    Comment: Occa   Drug use: Yes    Frequency: 3.0 times per week    Types: Marijuana    ROS   Objective:   Vitals: BP (!) 145/84 (BP Location: Left Arm)   Pulse 75   Temp 98.8 F (37.1 C) (Oral)   Resp 18   SpO2 97%   Physical Exam Constitutional:      General: He is not in acute distress.    Appearance: Normal appearance. He is well-developed and normal weight. He is not ill-appearing, toxic-appearing or diaphoretic.  HENT:     Head: Normocephalic and atraumatic.     Right Ear: External ear normal.     Left Ear: External ear normal.     Nose: Nose normal.     Mouth/Throat:     Pharynx: Oropharynx is clear.  Eyes:     General: No scleral icterus.       Right eye: No discharge.        Left  eye: No discharge.     Extraocular Movements: Extraocular movements intact.  Cardiovascular:     Rate and Rhythm: Normal rate.  Pulmonary:     Effort: Pulmonary effort is normal.  Musculoskeletal:     Cervical back: Normal range of motion.  Skin:    Findings: Rash (Diffusely scattered solitary macular urticarial lesions over the forearms bilaterally and to a lesser degree over the forehead extending toward the superior parts of either malar area) present.  Neurological:     Mental Status: He is alert and oriented to person, place, and time.  Psychiatric:        Mood and Affect: Mood normal.        Behavior: Behavior normal.        Thought Content: Thought content normal.        Judgment: Judgment normal.     Assessment and Plan :   PDMP not reviewed this encounter.  1. Rhus dermatitis    Given the facial  involvement, recommend an oral prednisone  course for the next 10 days.  Hydroxyzine  for itching.  No signs of anaphylaxis.  Counseled patient on potential for adverse effects with medications prescribed/recommended today, ER and return-to-clinic precautions discussed, patient verbalized understanding.    Christopher Savannah, PA-C 07/25/24 1351

## 2024-07-25 NOTE — ED Triage Notes (Signed)
 Pt reports itchy rash due to poison ivy all over the body x 4 days. Reports his eyes are itchy too. OTC meds gives no relief.
